# Patient Record
Sex: Female | Born: 1990 | Race: Black or African American | Hispanic: No | Marital: Single | Smoking: Former smoker
Health system: Southern US, Community
[De-identification: ages and names within clinical notes are randomized; demographics above are authoritative.]

## PROBLEM LIST (undated history)

## (undated) DIAGNOSIS — D573 Sickle-cell trait: Secondary | ICD-10-CM

## (undated) DIAGNOSIS — T8859XA Other complications of anesthesia, initial encounter: Secondary | ICD-10-CM

## (undated) DIAGNOSIS — F419 Anxiety disorder, unspecified: Secondary | ICD-10-CM

## (undated) DIAGNOSIS — G932 Benign intracranial hypertension: Secondary | ICD-10-CM

## (undated) HISTORY — PX: CHOLECYSTECTOMY: SHX55

## (undated) HISTORY — DX: Benign intracranial hypertension: G93.2

## (undated) HISTORY — PX: FACIAL COSMETIC SURGERY: SHX629

---

## 2016-09-15 ENCOUNTER — Ambulatory Visit (HOSPITAL_COMMUNITY)
Admission: EM | Admit: 2016-09-15 | Discharge: 2016-09-15 | Disposition: A | Discharge status: Home / Self Care | Payer: BLUE CROSS/BLUE SHIELD | Attending: Internal Medicine | Admitting: Internal Medicine

## 2016-09-15 ENCOUNTER — Encounter (HOSPITAL_COMMUNITY): Payer: Self-pay | Admitting: *Deleted

## 2016-09-15 DIAGNOSIS — N898 Other specified noninflammatory disorders of vagina: Secondary | ICD-10-CM | POA: Insufficient documentation

## 2016-09-15 DIAGNOSIS — F17200 Nicotine dependence, unspecified, uncomplicated: Secondary | ICD-10-CM | POA: Insufficient documentation

## 2016-09-15 DIAGNOSIS — N930 Postcoital and contact bleeding: Secondary | ICD-10-CM | POA: Diagnosis not present

## 2016-09-15 DIAGNOSIS — Z3202 Encounter for pregnancy test, result negative: Secondary | ICD-10-CM | POA: Diagnosis not present

## 2016-09-15 DIAGNOSIS — N939 Abnormal uterine and vaginal bleeding, unspecified: Secondary | ICD-10-CM | POA: Diagnosis present

## 2016-09-15 LAB — POCT URINALYSIS DIP (DEVICE)
BILIRUBIN URINE: NEGATIVE
Glucose, UA: NEGATIVE mg/dL
Hgb urine dipstick: NEGATIVE
KETONES UR: NEGATIVE mg/dL
Leukocytes, UA: NEGATIVE
Nitrite: NEGATIVE
PH: 7 (ref 5.0–8.0)
Protein, ur: NEGATIVE mg/dL
Specific Gravity, Urine: 1.015 (ref 1.005–1.030)
Urobilinogen, UA: 0.2 mg/dL (ref 0.0–1.0)

## 2016-09-15 LAB — POCT PREGNANCY, URINE: Preg Test, Ur: NEGATIVE

## 2016-09-15 MED ORDER — CEFTRIAXONE SODIUM 250 MG IJ SOLR
INTRAMUSCULAR | Status: AC
  Filled 2016-09-15: qty 250

## 2016-09-15 MED ORDER — CEFTRIAXONE SODIUM 250 MG IJ SOLR
250.0000 mg | Freq: Once | INTRAMUSCULAR | Status: AC
  Administered 2016-09-15: 250 mg via INTRAMUSCULAR

## 2016-09-15 MED ORDER — AZITHROMYCIN 250 MG PO TABS
ORAL_TABLET | ORAL | Status: AC
  Filled 2016-09-15: qty 4

## 2016-09-15 MED ORDER — LIDOCAINE HCL (PF) 1 % IJ SOLN
INTRAMUSCULAR | Status: AC
  Filled 2016-09-15: qty 2

## 2016-09-15 MED ORDER — AZITHROMYCIN 250 MG PO TABS
1000.0000 mg | ORAL_TABLET | Freq: Once | ORAL | Status: AC
  Administered 2016-09-15: 1000 mg via ORAL

## 2016-09-15 MED ORDER — METRONIDAZOLE 500 MG PO TABS: 500.0000 mg | ORAL_TABLET | Freq: Two times a day (BID) | ORAL | 0 refills | Status: DC

## 2016-09-15 NOTE — ED Provider Notes (Signed)
CSN: [id number]     Arrival date & time 09/15/16  1308 History   None    Chief Complaint  Patient presents with  . Vaginal Bleeding   (Consider location/radiation/quality/duration/timing/severity/associated sxs/prior Treatment) Patient c/o bleeding after having sex.  She has been experiencing vaginal bleeding after sex and denies any pain with sex.   The history is provided by the patient.  Vaginal Bleeding  Severity:  Moderate Onset quality:  Sudden   History reviewed. No pertinent past medical history. History reviewed. No pertinent surgical history. History reviewed. No pertinent family history. Social History  Substance Use Topics  . Smoking status: Current Every Day Smoker  . Smokeless tobacco: Never Used  . Alcohol use Yes   OB History    No data available     Review of Systems  Constitutional: Negative.   HENT: Negative.   Eyes: Negative.   Respiratory: Negative.   Cardiovascular: Negative.   Gastrointestinal: Negative.   Endocrine: Negative.   Genitourinary: Positive for vaginal bleeding.  Musculoskeletal: Negative.   Allergic/Immunologic: Negative.   Neurological: Negative.     Allergies  Patient has no known allergies.  Home Medications   Prior to Admission medications   Medication Sig Start Date End Date Taking? Authorizing Provider  metroNIDAZOLE (FLAGYL) 500 MG tablet Take 1 tablet (500 mg total) by mouth 2 (two) times daily. 09/15/16   Deatra Canterxford, William J, FNP   Meds Ordered and Administered this Visit   Medications  cefTRIAXone (ROCEPHIN) injection 250 mg (not administered)  azithromycin (ZITHROMAX) tablet 1,000 mg (not administered)    BP 124/72 (BP Location: Right Arm)   Pulse 82   Temp 98.5 F (36.9 C) (Oral)   Resp 18   LMP 08/28/2016 (Within Days)   SpO2 98%  No data found.   Physical Exam  Constitutional: She appears well-developed and well-nourished.  HENT:  Head: Normocephalic and atraumatic.  Cardiovascular: Normal rate,  regular rhythm and normal heart sounds.   Pulmonary/Chest: Effort normal and breath sounds normal.  Abdominal: Soft. Bowel sounds are normal.  Genitourinary: Vagina normal.  Genitourinary Comments: BUS wnl Vagina wnl Cervix with bloody discharge and petichiae  Nursing note and vitals reviewed.   Urgent Care Course     Procedures (including critical care time)  Labs Review Labs Reviewed  POCT URINALYSIS DIP (DEVICE)  POCT PREGNANCY, URINE  CERVICOVAGINAL ANCILLARY ONLY    Imaging Review No results found.   Visual Acuity Review  Right Eye Distance:   Left Eye Distance:   Bilateral Distance:    Right Eye Near:   Left Eye Near:    Bilateral Near:         MDM   1. PCB (post coital bleeding)   2. Vaginal discharge    Azithromycin 250mg  x 4 Rocephin 250mg IM Cytology GC / Chlamydia Trich BD affirm  Referral to [facility]    [place], William J, FNP 09/15/16 2042    Deatra Canterxford, William J, FNP 09/15/16 2048

## 2016-09-15 NOTE — ED Triage Notes (Signed)
Pt  Reports   After   She  Has   Sex   She  Has  Vaginal  Bleeding      sllight   Low   abd  Cramping  As   Well

## 2016-09-16 LAB — CERVICOVAGINAL ANCILLARY ONLY
Bacterial vaginitis: NEGATIVE
Candida vaginitis: NEGATIVE
Chlamydia: NEGATIVE
Neisseria Gonorrhea: NEGATIVE
Trichomonas: NEGATIVE

## 2018-10-15 ENCOUNTER — Encounter: Payer: Self-pay | Admitting: Neurology

## 2018-10-15 NOTE — Progress Notes (Addendum)
Virtual Visit via Video Note The purpose of this virtual visit is to provide medical care while limiting exposure to the novel coronavirus.    Consent was obtained for video visit:  Yes.   Answered questions that patient had about telehealth interaction:  Yes.   I discussed the limitations, risks, security and privacy concerns of performing an evaluation and management service by telemedicine. I also discussed with the patient that there may be a patient responsible charge related to this service. The patient expressed understanding and agreed to proceed.  Pt location: Home Physician Location: office Name of referring provider:  Jilda Roche, OD I connected with Kaitlin Bryant at patients initiation/request on 10/16/2018 at  2:10 PM EDT by video enabled telemedicine application and verified that I am speaking with the correct person using two identifiers. Pt MRN:  [id number] Pt DOB:  09-09-1990 Video Participants:  Kaitlin Bryant   History of Present Illness:  Kaitlin Bryant is a 28 year old African American woman who presents for papilledema.  History supplemented by referring provider note.  For years, she reports pulsatile tinnitus, like a whooshing sound in her ears.  She has occasional headaches which are manageable.  She denies blurred vision or visual obscurations.  She typically has an eye exam every one or two years.  Her last exam was at most 2 years ago.  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  She does report having gained at least 20 lbs over the past year.  She is not on birth control.   Past Medical History: History reviewed. No pertinent past medical history.  Medications: Outpatient Encounter Medications as of 10/16/2018  Medication Sig  . metroNIDAZOLE (FLAGYL) 500 MG tablet Take 1 tablet (500 mg total) by mouth 2 (two) times daily.   No facility-administered encounter medications on file as of 10/16/2018.      Allergies: No Known Allergies  Family History: No family history on file.  Social History: Social History   Socioeconomic History  . Marital status: Single    Spouse name: Not on file  . Number of children: Not on file  . Years of education: Not on file  . Highest education level: Not on file  Occupational History  . Not on file  Social Needs  . Financial resource strain: Not on file  . Food insecurity    Worry: Not on file    Inability: Not on file  . Transportation needs    Medical: Not on file    Non-medical: Not on file  Tobacco Use  . Smoking status: Current Every Day Smoker  . Smokeless tobacco: Never Used  Substance and Sexual Activity  . Alcohol use: Yes  . Drug use: Not on file  . Sexual activity: Yes  Lifestyle  . Physical activity    Days per week: Not on file    Minutes per session: Not on file  . Stress: Not on file  Relationships  . Social Herbalist on phone: Not on file    Gets together: Not on file    Attends religious service: Not on file    Active member of club or organization: Not on file    Attends meetings of clubs or organizations: Not on file    Relationship status: Not on file  . Intimate partner violence    Fear of current or ex partner: Not on file    Emotionally abused: Not on  file    Physically abused: Not on file    Forced sexual activity: Not on file  Other Topics Concern  . Not on file  Social History Narrative  . Not on file    Observations/Objective:   Height 5\' 5"  (1.651 m), weight 250 lb (113.4 kg). No acute distress. Alert and oriented.  Speech fluent and not dysarthric.  Language intact.  Face symmetric  Assessment and Plan:   1. Papilledema, bilateral.  Concern for idiopathic intracranial hypertension  1.  Check MRI of brain/orbits with and without contrast and MRV of brain to rule out mass lesion or venous sinus thrombosis. 2.  MRI will be followed by lumbar puncture, measuring opening pressure and  checking CSF cell count, protein, glucose, gram stain and culture. 3.  Further recommendations pending results.  If testing consistent with IIH, will initiate acetazolamide 500mg  twice daily and have her get a repeat eye exam in 6 weeks. 4.  Discussed importance of weight loss 5.  Follow up with me in 4 months.  Follow Up Instructions:    -I discussed the assessment and treatment plan with the patient. The patient was provided an opportunity to ask questions and all were answered. The patient agreed with the plan and demonstrated an understanding of the instructions.   The patient was advised to call back or seek an in-person evaluation if the symptoms worsen or if the condition fails to improve as anticipated.    Total Time spent in visit with the patient was:  40 minutes  Cira ServantAdam Robert Jaffe, DO

## 2018-10-16 ENCOUNTER — Encounter: Payer: Self-pay | Admitting: Neurology

## 2018-10-16 ENCOUNTER — Telehealth (INDEPENDENT_AMBULATORY_CARE_PROVIDER_SITE_OTHER): Payer: Medicaid Other | Admitting: Neurology

## 2018-10-16 ENCOUNTER — Other Ambulatory Visit: Payer: Self-pay

## 2018-10-16 VITALS — Ht 65.0 in | Wt 250.0 lb

## 2018-10-16 DIAGNOSIS — H4710 Unspecified papilledema: Secondary | ICD-10-CM | POA: Diagnosis not present

## 2018-10-17 ENCOUNTER — Telehealth: Payer: Self-pay | Admitting: Neurology

## 2018-10-17 NOTE — Telephone Encounter (Signed)
Pt request return call [phone] pt has concerns about prognosis and pt states provider mentioned medication. Pt wants to know also if any medication will be called in and if so please advise and instruct. Pt uses [facility] off [address].

## 2018-10-17 NOTE — Telephone Encounter (Signed)
Called and discussed the types of imaging studies ordered, the LP to be performed after, and the possibility of medication.   We went over weight loss recommendation also.  I provided her with contact information and location of [facility] and advised her if she has not heard from them within 5-7 business days, she may call to schedule. I talked with her about My Chart and Pt wishes to sign up and utilize. I generated a code and provided that to her. The Pt will call or send message via My Chart with any additional questions.

## 2018-10-17 NOTE — Addendum Note (Signed)
Addended by: Clois Comber on: 10/17/2018 09:08 AM   Modules accepted: Orders

## 2018-11-14 ENCOUNTER — Ambulatory Visit
Admission: RE | Admit: 2018-11-14 | Discharge: 2018-11-14 | Disposition: A | Discharge status: Home / Self Care | Payer: Medicaid Other | Source: Ambulatory Visit | Attending: Neurology | Admitting: Neurology

## 2018-11-14 ENCOUNTER — Other Ambulatory Visit: Payer: Self-pay

## 2018-11-14 DIAGNOSIS — H4710 Unspecified papilledema: Secondary | ICD-10-CM

## 2018-11-14 MED ORDER — GADOBENATE DIMEGLUMINE 529 MG/ML IV SOLN
20.0000 mL | Freq: Once | INTRAVENOUS | Status: AC | PRN
  Administered 2018-11-14: 20 mL via INTRAVENOUS

## 2018-11-16 ENCOUNTER — Telehealth: Payer: Self-pay | Admitting: Neurology

## 2018-11-16 ENCOUNTER — Telehealth: Payer: Self-pay

## 2018-11-16 ENCOUNTER — Other Ambulatory Visit: Payer: Medicaid Other

## 2018-11-16 NOTE — Telephone Encounter (Signed)
-----   Message from Pieter Partridge, DO sent at 11/15/2018  3:50 PM EDT ----- MRI of brain shows a spot that is nonspecific.  Unclear clinical significance, but would repeat MRI of brain without contrast in 6 months to evaluate for any changes.  We should now proceed with lumbar puncture, measuring opening pressure and checking CSF cell count, protein, glucose, gram stain and culture.

## 2018-11-16 NOTE — Telephone Encounter (Signed)
See MRI results note

## 2018-11-16 NOTE — Telephone Encounter (Signed)
Called Pt and advised of results and to proceed with LP

## 2018-11-16 NOTE — Telephone Encounter (Signed)
Patient was calling in to ask some questions about results she got back on MyChart. Thanks!

## 2018-11-19 ENCOUNTER — Other Ambulatory Visit: Payer: Self-pay

## 2018-11-19 ENCOUNTER — Ambulatory Visit
Admission: RE | Admit: 2018-11-19 | Discharge: 2018-11-19 | Disposition: A | Discharge status: Home / Self Care | Payer: Medicaid Other | Source: Ambulatory Visit | Attending: Neurology | Admitting: Neurology

## 2018-11-19 ENCOUNTER — Telehealth: Payer: Self-pay

## 2018-11-19 DIAGNOSIS — Z79899 Other long term (current) drug therapy: Secondary | ICD-10-CM

## 2018-11-19 DIAGNOSIS — G932 Benign intracranial hypertension: Secondary | ICD-10-CM

## 2018-11-19 DIAGNOSIS — H4710 Unspecified papilledema: Secondary | ICD-10-CM

## 2018-11-19 MED ORDER — ACETAZOLAMIDE ER 500 MG PO CP12: 500.0000 mg | ORAL_CAPSULE | Freq: Two times a day (BID) | ORAL | 3 refills | Status: DC

## 2018-11-19 NOTE — Telephone Encounter (Signed)
-----   Message from Pieter Partridge, DO sent at 11/19/2018 10:21 AM EDT ----- I would also like to check a baseline BMP.

## 2018-11-19 NOTE — Discharge Instructions (Signed)

## 2018-11-19 NOTE — Telephone Encounter (Signed)
Called and advised Pt of LP results,  to start acetazolamide, and BMP labs

## 2018-11-20 ENCOUNTER — Telehealth: Payer: Self-pay | Admitting: Neurology

## 2018-11-20 DIAGNOSIS — G932 Benign intracranial hypertension: Secondary | ICD-10-CM

## 2018-11-20 NOTE — Telephone Encounter (Signed)
Patient left msg with after hours and wants her RX sent to a different pharm- [facility] and the phone # is [phone]. Acetazolamide medication. She said she has no symptoms.

## 2018-11-21 MED ORDER — ACETAZOLAMIDE ER 500 MG PO CP12: 500.0000 mg | ORAL_CAPSULE | Freq: Two times a day (BID) | ORAL | 3 refills | Status: DC

## 2018-11-21 NOTE — Telephone Encounter (Signed)
Acetazolamide resent to [facility] per patient request.  Informed patient.

## 2018-11-22 ENCOUNTER — Other Ambulatory Visit: Payer: Self-pay

## 2018-11-22 ENCOUNTER — Other Ambulatory Visit (INDEPENDENT_AMBULATORY_CARE_PROVIDER_SITE_OTHER): Payer: Medicaid Other

## 2018-11-22 DIAGNOSIS — Z79899 Other long term (current) drug therapy: Secondary | ICD-10-CM | POA: Diagnosis not present

## 2018-11-23 LAB — CSF CELL COUNT WITH DIFFERENTIAL
RBC Count, CSF: 0 cells/uL (ref 0–10)
WBC, CSF: 0 cells/uL (ref 0–5)

## 2018-11-23 LAB — CSF CULTURE: MICRO NUMBER:: 683309

## 2018-11-23 LAB — CSF CULTURE W GRAM STAIN
Result:: NO GROWTH
SPECIMEN QUALITY:: ADEQUATE

## 2018-11-23 LAB — BASIC METABOLIC PANEL
BUN: 14 mg/dL (ref 7–25)
CO2: 18 mmol/L — ABNORMAL LOW (ref 20–32)
Calcium: 9.4 mg/dL (ref 8.6–10.2)
Chloride: 109 mmol/L (ref 98–110)
Creat: 0.98 mg/dL (ref 0.50–1.10)
Glucose, Bld: 102 mg/dL — ABNORMAL HIGH (ref 65–99)
Potassium: 4 mmol/L (ref 3.5–5.3)
Sodium: 137 mmol/L (ref 135–146)

## 2018-11-23 LAB — PROTEIN, CSF: Total Protein, CSF: 28 mg/dL (ref 15–45)

## 2018-11-23 LAB — GLUCOSE, CSF: Glucose, CSF: 56 mg/dL (ref 40–80)

## 2018-12-17 ENCOUNTER — Telehealth: Payer: Self-pay | Admitting: Neurology

## 2018-12-17 NOTE — Telephone Encounter (Signed)
Patient was calling in about her medication. Thanks!

## 2018-12-18 NOTE — Telephone Encounter (Signed)
Called and advised Pt to continue medication until seen and Dr. Tomi Likens will reassess.  Reminded her to make appt with eye dr. ASAP.

## 2018-12-18 NOTE — Telephone Encounter (Signed)
Patient is wanting to know if she is supposed to keep taking the acetazolamide medication. Please call her back at [phone]. Thanks!

## 2019-02-18 NOTE — Progress Notes (Signed)
NEUROLOGY FOLLOW UP OFFICE NOTE  Barbie Croston [id number]  HISTORY OF PRESENT ILLNESS: Kaitlin Bryant is a 28 year old female who follows up for idiopathic intracranial hypertension.  UPDATE: MRI of brain/orbits with and without contrast and MRV of head from 11/14/2018 showed small nonspecific 4 mm T2/FLAIR signal focus in the dorsal left thalamus with no associated enhancement.  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  MRV showed effaced appearance of the bilateral transverse/sigmoid sinus junctions but no dural sinus thrombosis.   She underwent LP on 11/19/2018, which demonstrated an elevated opening pressure of 33 cm H2O.  CSF cell count, protein, glucose and Gram stain & culture negative.    She was started on acetazolamide 500mg  twice daily.  She was advised to have a follow exam with her eye doctor.  She hasn't had a chance to follow up.    She reports that the pulsatile tinnitus is much less.  She hasn't had any worsening of vision.  Headaches improved.  She has lost a little over 10 lbs since last time.    HISTORY: For years, she reports pulsatile tinnitus, like a whooshing sound in her ears.  She has occasional headaches which are manageable.  She denies blurred vision or visual obscurations.  She typically has an eye exam every one or two years.  Her last exam was at most 2 years ago.  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  She does report having gained at least 20 lbs over the past year.  She is not on birth control.   PAST MEDICAL HISTORY: No past medical history on file.  MEDICATIONS: Current Outpatient Medications on File Prior to Visit  Medication Sig Dispense Refill  . acetaZOLAMIDE (DIAMOX) 500 MG capsule Take 1 capsule (500 mg total) by mouth 2 (two) times daily. 180 capsule 3   No current facility-administered medications on file prior to  visit.     ALLERGIES: No Known Allergies  FAMILY HISTORY: Family History  Problem Relation Age of Onset  . Healthy Mother   . Healthy Father   . Healthy Sister   . Healthy Brother   . Healthy Daughter     SOCIAL HISTORY: Social History   Socioeconomic History  . Marital status: Single    Spouse name: Not on file  . Number of children: 1  . Years of education: Not on file  . Highest education level: Not on file  Occupational History  . Not on file  Social Needs  . Financial resource strain: Not on file  . Food insecurity    Worry: Not on file    Inability: Not on file  . Transportation needs    Medical: Not on file    Non-medical: Not on file  Tobacco Use  . Smoking status: Current Every Day Smoker  . Smokeless tobacco: Never Used  Substance and Sexual Activity  . Alcohol use: Yes  . Drug use: Not Currently  . Sexual activity: Yes  Lifestyle  . Physical activity    Days per week: Not on file    Minutes per session: Not on file  . Stress: Not on file  Relationships  . Social on phone: Not on file    Gets together: Not on file    Attends religious service: Not on file    Active member of club or organization: Not  on file    Attends meetings of clubs or organizations: Not on file    Relationship status: Not on file  . Intimate partner violence    Fear of current or ex partner: Not on file    Emotionally abused: Not on file    Physically abused: Not on file    Forced sexual activity: Not on file  Other Topics Concern  . Not on file  Social History Narrative   Right handed   Lives in single story home with daughter and boyfreind   Works at [employer]: Constitutional: No fevers, chills, or sweats, no generalized fatigue, change in appetite Eyes: No visual changes, double vision, eye pain Ear, nose and throat: No hearing loss, ear pain, nasal congestion, sore throat Cardiovascular: No chest pain, palpitations  Respiratory:  No shortness of breath at rest or with exertion, wheezes GastrointestinaI: No nausea, vomiting, diarrhea, abdominal pain, fecal incontinence Genitourinary:  No dysuria, urinary retention or frequency Musculoskeletal:  No neck pain, back pain Integumentary: No rash, pruritus, skin lesions Neurological: as above Psychiatric: No depression, insomnia, anxiety Endocrine: No palpitations, fatigue, diaphoresis, mood swings, change in appetite, change in weight, increased thirst Hematologic/Lymphatic:  No purpura, petechiae. Allergic/Immunologic: no itchy/runny eyes, nasal congestion, recent allergic reactions, rashes  PHYSICAL EXAM: Blood pressure 102/70, pulse 92, height 5\' 5"  (1.651 m), weight 244 lb (110.7 kg), SpO2 99 %. General: No acute distress.  Patient appears well-groomed.   Head:  Normocephalic/atraumatic Eyes:  Fundi examined but not visualized Neck: supple, no paraspinal tenderness, full range of motion Heart:  Regular rate and rhythm Lungs:  Clear to auscultation bilaterally Back: No paraspinal tenderness Neurological Exam: alert and oriented to person, place, and time. Attention span and concentration intact, recent and remote memory intact, fund of knowledge intact.  Speech fluent and not dysarthric, language intact.  CN II-XII intact. Bulk and tone normal, muscle strength 5/5 throughout.  Sensation to light touch  intact.  Deep tendon reflexes 2+ throughout, toes downgoing.  Finger to nose and heel to shin testing intact.  Gait normal, Romberg negative.  IMPRESSION: Idiopathic intracranial hypertension  PLAN: 1.  Acetazolamide 500mg  twice daily 2.  Refer to ophthalmology 3.  Weight loss 4.  Follow up in 4 months.  Metta Clines, DO

## 2019-02-19 ENCOUNTER — Other Ambulatory Visit: Payer: Self-pay

## 2019-02-19 ENCOUNTER — Ambulatory Visit: Payer: Medicaid Other | Admitting: Neurology

## 2019-02-19 ENCOUNTER — Encounter: Payer: Self-pay | Admitting: Neurology

## 2019-02-19 VITALS — BP 102/70 | HR 92 | Ht 65.0 in | Wt 244.0 lb

## 2019-02-19 DIAGNOSIS — G932 Benign intracranial hypertension: Secondary | ICD-10-CM | POA: Diagnosis not present

## 2019-02-19 NOTE — Patient Instructions (Addendum)
1.  We will refer you to the eye doctor 2.  Continue acetazolamide 500mg  twice daily 3.  Follow up in 4 months  Dr.Weaver at [facility] [address] [place], [place] [address] Phone: [phone]

## 2019-06-06 ENCOUNTER — Telehealth: Payer: Self-pay | Admitting: Neurology

## 2019-06-06 ENCOUNTER — Encounter: Payer: Self-pay | Admitting: Neurology

## 2019-06-06 NOTE — Telephone Encounter (Signed)
Pt called and informed It just sounds like muscle tightness from stress.  I don't think it is related to the medication or increased intracranial pressure

## 2019-06-06 NOTE — Telephone Encounter (Signed)
I can't really give any advise because I don't know what she is talking about.  The acetazolamide isn't a new medication for her.

## 2019-06-06 NOTE — Telephone Encounter (Signed)
Patient wants to speak to someone about medication acetazolamide. She states that her forehead is jumping and she would like to know what to do please call

## 2019-06-06 NOTE — Telephone Encounter (Signed)
It just sounds like muscle tightness from stress.  I don't think it is related to the medication or increased intracranial pressure.

## 2019-06-06 NOTE — Telephone Encounter (Signed)
Spoke with pt she stated that this has been going on for 2 weeks her forehead gets tight then its relaxed, she has some increase in stress, pt stated that she wasn't going to call but her mom wanted her to call to just be safe,

## 2019-06-25 NOTE — Progress Notes (Signed)
Virtual Visit via Video Note The purpose of this virtual visit is to provide medical care while limiting exposure to the novel coronavirus.    Consent was obtained for video visit:  Yes.   Answered questions that patient had about telehealth interaction:  Yes.   I discussed the limitations, risks, security and privacy concerns of performing an evaluation and management service by telemedicine. I also discussed with the patient that there may be a patient responsible charge related to this service. The patient expressed understanding and agreed to proceed.  Pt location: Home Physician Location: office Name of referring provider:  No ref. provider found I connected with Kaitlin Bryant at patients initiation/request on 06/27/2019 at 10:10 AM EST by video enabled telemedicine application and verified that I am speaking with the correct person using two identifiers. Pt MRN:  [id number] Pt DOB:  [birth year] Video Participants:  [name]   History of Present Illness:  Kaitlin Bryant is a 29 year old female who follows up for idiopathic intracranial hypertension.  UPDATE: Current medication:  Acetazolamide 500mg  once daily  She saw Dr. Kathlen Mody of ophthalmology on 02/21/2019. She did not demonstrate disc edema on exam.  For the past 2 weeks, she had been taking acetazolamide 500mg  once daily.  She had follow up with Dr. Kathlen Mody on 06/24/2019 and still did not demonstrate any papilledema.  She feels fine.  No headache or blurred vision.     HISTORY: For years, she reports pulsatile tinnitus, like a whooshing sound in her ears. She has occasional headaches which are manageable. She denies blurred vision or visual obscurations. She typically has an eye exam every one or two years. Her last exam was at most 2 years ago. 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 She does report having gained at least 20 lbs over the past year. She is not on birth  control.   MRI of brain/orbits with and without contrast and MRV of head from 11/14/2018 showed small nonspecific 4 mm T2/FLAIR signal focus in the dorsal left thalamus with no associated enhancement.  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  MRV showed effaced appearance of the bilateral transverse/sigmoid sinus junctions but no dural sinus thrombosis.   She underwent LP on 11/19/2018, which demonstrated an elevated opening pressure of 33 cm H2O.  CSF cell count, protein, glucose and Gram stain & culture negative.    Past Medical History: No past medical history on file.  Medications: Outpatient Encounter Medications as of 06/27/2019  Medication Sig  . acetaZOLAMIDE (DIAMOX) 500 MG capsule Take 1 capsule (500 mg total) by mouth 2 (two) times daily.   No facility-administered encounter medications on file as of 06/27/2019.    Allergies: No Known Allergies  Family History: Family History  Problem Relation Age of Onset  . Healthy Mother   . Healthy Father   . Healthy Sister   . Healthy Brother   . Healthy Daughter     Social History: Social History   Socioeconomic History  . Marital status: Single    Spouse name: Not on file  . Number of children: 1  . Years of education: Not on file  . Highest education level: High school graduate  Occupational History  . Not on file  Tobacco Use  . Smoking status: Current Every Day Smoker  . Smokeless tobacco: Never Used  Substance and Sexual Activity  . Alcohol  use: Yes  . Drug use: Not Currently  . Sexual activity: Yes  Other Topics Concern  . Not on file  Social History Narrative   Right handed   Lives in single story home with daughter and boyfreind   Works at [employer] Resource Strain:   . Difficulty of Paying Living Expenses: Not on file  Food Insecurity:   . Worried About Programme researcher, broadcasting/film/video in the  Last Year: Not on file  . Ran Out of Food in the Last Year: Not on file  Transportation Needs:   . Lack of Transportation (Medical): Not on file  . Lack of Transportation (Non-Medical): Not on file  Physical Activity:   . Days of Exercise per Week: Not on file  . Minutes of Exercise per Session: Not on file  Stress:   . Feeling of Stress : Not on file  Social Connections:   . Frequency of Communication with Friends and Family: Not on file  . Frequency of Social Gatherings with Friends and Family: Not on file  . Attends Religious Services: Not on file  . Active Member of Clubs or Organizations: Not on file  . Attends Banker Meetings: Not on file  . Marital Status: Not on file  Intimate Partner Violence:   . Fear of Current or Ex-Partner: Not on file  . Emotionally Abused: Not on file  . Physically Abused: Not on file  . Sexually Abused: Not on file    Observations/Objective:   Height 5\' 4"  (1.626 m), weight 240 lb (108.9 kg). No acute distress.  Alert and oriented.  Speech fluent and not dysarthric.  Language intact.  Eyes orthophoric on primary gaze.  Face symmetric.  Assessment and Plan:   Idiopathic intracranial hypertension.  Currently without papilledema.  1.  Continue acetazolamide 500mg  daily for 2 weeks, then stop 2.  Follow up with Dr. in 3 months for repeat eye exam 3.  Weight loss  4.  Follow up with me in 4 months.  Follow Up Instructions:    -I discussed the assessment and treatment plan with the patient. The patient was provided an opportunity to ask questions and all were answered. The patient agreed with the plan and demonstrated an understanding of the instructions.   The patient was advised to call back or seek an in-person evaluation if the symptoms worsen or if the condition fails to improve as anticipated.    , DO

## 2019-06-26 ENCOUNTER — Encounter: Payer: Self-pay | Admitting: Neurology

## 2019-06-27 ENCOUNTER — Other Ambulatory Visit: Payer: Self-pay

## 2019-06-27 ENCOUNTER — Encounter: Payer: Self-pay | Admitting: Neurology

## 2019-06-27 ENCOUNTER — Telehealth (INDEPENDENT_AMBULATORY_CARE_PROVIDER_SITE_OTHER): Payer: Medicaid Other | Admitting: Neurology

## 2019-06-27 VITALS — Ht 64.0 in | Wt 240.0 lb

## 2019-06-27 DIAGNOSIS — G932 Benign intracranial hypertension: Secondary | ICD-10-CM

## 2019-10-28 NOTE — Progress Notes (Signed)
NEUROLOGY FOLLOW UP OFFICE NOTE  Kaitlin Bryant [id number]  HISTORY OF PRESENT ILLNESS: Kaitlin Bryant is a 16 year oldfemale who follows up for idiopathic intracranial hypertension.  UPDATE: Discontinued acetazolamide in March.  She is feeling well.  She has not yet followed up with Dr. Kathlen Mody.  Sometimes notes mild pulsatile tinnitus occasionally but nothing like how it was in the past.  No visual obscurations or headaches.    HISTORY: For years, she reports pulsatile tinnitus, like a whooshing sound in her ears. She has occasional headaches which are manageable. She denies blurred vision or visual obscurations. She typically has an eye exam every one or two years. Her last exam was at most 2 years ago. 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 She does report having gained at least 20 lbs over the past year. She is not on birth control.  MRI of brain/orbits with and without contrast and MRV of head from 11/14/2018 showed small nonspecific 4 mm T2/FLAIR signal focus in the dorsal left thalamus with no associated enhancement. 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 MRV showed effaced appearance of the bilateral transverse/sigmoid sinus junctions but no dural sinus thrombosis.   She underwent LP on 11/19/2018, which demonstrated an elevated opening pressure of 33 cm H2O. CSF cell count, protein, glucose and Gram stain &culture negative.   She saw Dr. Kathlen Mody of ophthalmology on 02/21/2019. She did not demonstrate disc edema on exam.  Follow up with Dr. Kathlen Mody on 06/24/2019 and still did not demonstrate any papilledema.   PAST MEDICAL HISTORY: Past Medical History:  Diagnosis Date   Idiopathic intracranial hypertension     MEDICATIONS: Current Outpatient Medications on File Prior to Visit  Medication Sig Dispense Refill   acetaZOLAMIDE (DIAMOX)  500 MG capsule Take 1 capsule (500 mg total) by mouth 2 (two) times daily. 180 capsule 3   No current facility-administered medications on file prior to visit.    ALLERGIES: No Known Allergies  FAMILY HISTORY: Family History  Problem Relation Age of Onset   Healthy Mother    Healthy Father    Healthy Sister    Healthy Brother    Healthy Daughter     SOCIAL HISTORY: Social History   Socioeconomic History   Marital status: Single    Spouse name: Not on file   Number of children: 1   Years of education: Not on file   Highest education level: High school graduate  Occupational History   Not on file  Tobacco Use   Smoking status: Current Every Day Smoker   Smokeless tobacco: Never Used  Scientific laboratory technician Use: Never used  Substance and Sexual Activity   Alcohol use: Yes   Drug use: Not Currently   Sexual activity: Yes  Other Topics Concern   Not on file  Social History Narrative   Right handed   Lives in single story home with daughter and boyfreind   Works at [employer] Strain:    Difficulty of Paying Living Expenses:   Food Insecurity:    Worried About Charity fundraiser in the Last Year:    Arboriculturist in the Last Year:   Transportation Needs:    Film/video editor (Medical):    Lack of Transportation (Non-Medical):   Physical Activity:    Days of Exercise  per Week:    Minutes of Exercise per Session:   Stress:    Feeling of Stress :   Social Connections:    Frequency of Communication with Friends and Family:    Frequency of Social Gatherings with Friends and Family:    Attends Religious Services:    Active Member of Clubs or Organizations:    Attends Engineer, structural:    Marital Status:   Intimate Partner Violence:    Fear of Current or Ex-Partner:    Emotionally Abused:    Physically Abused:    Sexually Abused:      PHYSICAL  EXAM: Blood pressure 123/84, pulse 86, height 5\' 4"  (1.626 m), weight 252 lb (114.3 kg), SpO2 99 %. General: No acute distress.  Patient appears well-groomed.   Head:  Normocephalic/atraumatic Eyes:  Fundi examined but not visualized Neck: supple, no paraspinal tenderness, full range of motion Heart:  Regular rate and rhythm Lungs:  Clear to auscultation bilaterally Back: No paraspinal tenderness Neurological Exam: alert and oriented to person, place, and time. Attention span and concentration intact, recent and remote memory intact, fund of knowledge intact.  Speech fluent and not dysarthric, language intact.  CN II-XII intact. Bulk and tone normal, muscle strength 5/5 throughout.  Sensation to light touch, temperature and vibration intact.  Deep tendon reflexes 2+ throughout, toes downgoing.  Finger to nose and heel to shin testing intact.  Gait normal, Romberg negative.  IMPRESSION: Idiopathic intracranial hypertension  PLAN: 1.  Follow up with Dr. for re-evaluation.  Any recurrence of papilledema, will restart acetazolamide 2.  Follow up in 6 months  Alben Spittle, DO

## 2019-10-29 ENCOUNTER — Encounter: Payer: Self-pay | Admitting: Neurology

## 2019-10-29 ENCOUNTER — Ambulatory Visit: Payer: Medicaid Other | Admitting: Neurology

## 2019-10-29 ENCOUNTER — Other Ambulatory Visit: Payer: Self-pay

## 2019-10-29 VITALS — BP 123/84 | HR 86 | Ht 64.0 in | Wt 252.0 lb

## 2019-10-29 DIAGNOSIS — G932 Benign intracranial hypertension: Secondary | ICD-10-CM

## 2019-10-29 NOTE — Patient Instructions (Signed)
1.  Follow up with Dr. Alben Spittle.  Contact his office. 2.  Follow up in 6 months.

## 2020-04-27 NOTE — Progress Notes (Deleted)
NEUROLOGY FOLLOW UP OFFICE NOTE  Kaitlin Bryant [id number]   Subjective:  Kaitlin Bryant is a 29 year oldfemale who follows up for idiopathic intracranial hypertension.  UPDATE: Off acetazolamide.  ***.  Has not followed up with Dr. Alben Spittle.    HISTORY: For years, she reports pulsatile tinnitus, like a whooshing sound in her ears. She has occasional headaches which are manageable. She denies blurred vision or visual obscurations. She typically has an eye exam every one or two years. Her last exam was at most 2 years ago. 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 She does report having gained at least 20 lbs over the past year. She is not on birth control.  MRI of brain/orbits with and without contrast and MRV of head from 11/14/2018 showed small nonspecific 4 mm T2/FLAIR signal focus in the dorsal left thalamus with no associated enhancement. 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 MRV showed effaced appearance of the bilateral transverse/sigmoid sinus junctions but no dural sinus thrombosis.   She underwent LP on 11/19/2018, which demonstrated an elevated opening pressure of 33 cm H2O. CSF cell count, protein, glucose and Gram stain &culture negative.  She saw Dr. Alben Spittle of ophthalmology on 02/21/2019. She did not demonstrate disc edema on exam.Follow up with Dr. Alben Spittle on 06/24/2019 and still did not demonstrate any papilledema.   PAST MEDICAL HISTORY: Past Medical History:  Diagnosis Date  . Idiopathic intracranial hypertension     MEDICATIONS: Current Outpatient Medications on File Prior to Visit  Medication Sig Dispense Refill  . acetaZOLAMIDE (DIAMOX) 500 MG capsule Take 1 capsule (500 mg total) by mouth 2 (two) times daily. (Patient not taking: Reported on 10/29/2019) 180 capsule 3   No current facility-administered medications on  file prior to visit.    ALLERGIES: No Known Allergies  FAMILY HISTORY: Family History  Problem Relation Age of Onset  . Healthy Mother   . Healthy Father   . Healthy Sister   . Healthy Brother   . Healthy Daughter     SOCIAL HISTORY: Social History   Socioeconomic History  . Marital status: Single    Spouse name: Not on file  . Number of children: 1  . Years of education: Not on file  . Highest education level: High school graduate  Occupational History  . Not on file  Tobacco Use  . Smoking status: Current Every Day Smoker  . Smokeless tobacco: Never Used  Vaping Use  . Vaping Use: Never used  Substance and Sexual Activity  . Alcohol use: Yes  . Drug use: Not Currently  . Sexual activity: Yes  Other Topics Concern  . 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  Patient appears well-groomed.   Head:  Normocephalic/atraumatic Eyes:  Fundi examined but not visualized Neck: supple, no paraspinal tenderness, full range of motion Heart:  Regular rate and rhythm Lungs:  Clear to auscultation bilaterally Back: No paraspinal tenderness  Neurological Exam: alert and oriented to person, place, and time. Attention span and concentration intact, recent and remote memory intact, fund of knowledge intact.  Speech fluent and not dysarthric, language intact.  CN II-XII intact. Bulk and tone normal, muscle strength 5/5 throughout.  Sensation to light touch, temperature and vibration intact.  Deep tendon reflexes 2+ throughout, toes downgoing.  Finger to nose and heel to shin testing  intact.  Gait normal, Romberg negative.   Assessment/Plan:   Idiopathic intracranial hypertension  ***  Shon Millet, DO

## 2020-04-29 ENCOUNTER — Ambulatory Visit: Payer: Medicaid Other | Admitting: Neurology

## 2020-11-30 ENCOUNTER — Other Ambulatory Visit: Payer: Self-pay

## 2020-11-30 ENCOUNTER — Ambulatory Visit (INDEPENDENT_AMBULATORY_CARE_PROVIDER_SITE_OTHER): Payer: Medicaid Other

## 2020-11-30 VITALS — BP 112/72 | HR 102 | Ht 64.0 in | Wt 259.3 lb

## 2020-11-30 DIAGNOSIS — Z3480 Encounter for supervision of other normal pregnancy, unspecified trimester: Secondary | ICD-10-CM | POA: Insufficient documentation

## 2020-11-30 DIAGNOSIS — Z3481 Encounter for supervision of other normal pregnancy, first trimester: Secondary | ICD-10-CM | POA: Diagnosis not present

## 2020-11-30 DIAGNOSIS — O3680X0 Pregnancy with inconclusive fetal viability, not applicable or unspecified: Secondary | ICD-10-CM

## 2020-11-30 DIAGNOSIS — Z3491 Encounter for supervision of normal pregnancy, unspecified, first trimester: Secondary | ICD-10-CM | POA: Insufficient documentation

## 2020-11-30 DIAGNOSIS — Z3A08 8 weeks gestation of pregnancy: Secondary | ICD-10-CM | POA: Diagnosis not present

## 2020-11-30 MED ORDER — BLOOD PRESSURE KIT DEVI: 1.0000 | 0 refills | Status: AC

## 2020-11-30 NOTE — Progress Notes (Signed)
New OB Intake  I connected with  Kaitlin Bryant on 11/30/20 at 10:15 AM EDT by in person. Video Visit and verified that I am speaking with the correct person using two identifiers. Nurse is located at [facility] and pt is located at [place].  I discussed the limitations, risks, security and privacy concerns of performing an evaluation and management service by telephone and the availability of in person appointments. I also discussed with the patient that there may be a patient responsible charge related to this service. The patient expressed understanding and agreed to proceed.  I explained I am completing New OB Intake today. We discussed her EDD of 07/10/21 that is based on early u/s. Pt is G2/P1001. I reviewed her allergies, medications, Medical/Surgical/OB history, and appropriate screenings. I informed her of [facility] services. Based on history, this is a/an  pregnancy uncomplicated .   There are no problems to display for this patient.   Concerns addressed today  Delivery Plans:  Plans to deliver at [facility].   MyChart/Babyscripts MyChart access verified. I explained pt will have some visits in office and some virtually. Babyscripts instructions given and order placed. Patient verifies receipt of registration text/e-mail. Account successfully created and app downloaded.  Blood Pressure Cuff  Blood pressure cuff ordered for patient to pick-up from [employer]. Explained after first prenatal appt pt will check weekly and document in Babyscripts.  Weight scale: Patient has access to a weight scale at home.   Anatomy US Explained first scheduled [place] will be around 19 weeks. Dating and viability scan performed today.  Labs Discussed Avelina Laine genetic screening with patient. Would like both Panorama and Horizon drawn at new OB visit. Routine prenatal labs needed.  Covid Vaccine Patient has not covid vaccine.   Mother/ Baby Dyad Candidate?    If yes, offer as possibility  Informed  patient of [facility] website  and placed link in her AVS.   Social Determinants of Health Food Insecurity: Patient denies food insecurity. WIC Referral: Patient is interested in referral to [facility].  Transportation: Patient denies transportation needs. Childcare: Discussed no children allowed at ultrasound appointments. Offered childcare services; patient declines childcare services at this time.   Placed OB Box on problem list and updated  First visit review I reviewed new OB appt with pt. I explained she will have a pelvic exam, ob bloodwork with genetic screening, and PAP smear. Explained pt will be seen by Mariel Aloe at first visit; encounter routed to appropriate provider. Explained that patient will be seen by pregnancy navigator following visit with provider. [facility] information placed in AVS.   Hamilton Capri, RN 11/30/2020  10:19 AM

## 2020-12-01 NOTE — Progress Notes (Signed)
Patient was assessed and managed by nursing staff during this encounter. I have reviewed the chart and agree with the documentation and plan. I have also made any necessary editorial changes.  Catalina Antigua, MD 12/01/2020 10:56 AM

## 2020-12-08 ENCOUNTER — Other Ambulatory Visit (HOSPITAL_COMMUNITY)
Admission: RE | Admit: 2020-12-08 | Discharge: 2020-12-08 | Disposition: A | Discharge status: Home / Self Care | Payer: Medicaid Other | Source: Ambulatory Visit | Attending: Obstetrics and Gynecology | Admitting: Obstetrics and Gynecology

## 2020-12-08 ENCOUNTER — Other Ambulatory Visit: Payer: Self-pay

## 2020-12-08 ENCOUNTER — Ambulatory Visit (INDEPENDENT_AMBULATORY_CARE_PROVIDER_SITE_OTHER): Payer: Medicaid Other | Admitting: Obstetrics and Gynecology

## 2020-12-08 VITALS — BP 117/79 | HR 93 | Wt 256.3 lb

## 2020-12-08 DIAGNOSIS — Z3481 Encounter for supervision of other normal pregnancy, first trimester: Secondary | ICD-10-CM

## 2020-12-08 DIAGNOSIS — Z8669 Personal history of other diseases of the nervous system and sense organs: Secondary | ICD-10-CM

## 2020-12-08 DIAGNOSIS — Z6841 Body Mass Index (BMI) 40.0 and over, adult: Secondary | ICD-10-CM | POA: Insufficient documentation

## 2020-12-08 DIAGNOSIS — Z3A09 9 weeks gestation of pregnancy: Secondary | ICD-10-CM | POA: Insufficient documentation

## 2020-12-08 DIAGNOSIS — O99211 Obesity complicating pregnancy, first trimester: Secondary | ICD-10-CM | POA: Insufficient documentation

## 2020-12-08 MED ORDER — VITAFOL ULTRA 29-0.6-0.4-200 MG PO CAPS: 1.0000 | ORAL_CAPSULE | Freq: Every day | ORAL | 11 refills | Status: DC
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
## 2020-12-08 NOTE — Progress Notes (Signed)
Patient presents for New OB. Patient has no concerns today.  °

## 2020-12-09 ENCOUNTER — Other Ambulatory Visit: Payer: Self-pay

## 2020-12-09 DIAGNOSIS — B9689 Other specified bacterial agents as the cause of diseases classified elsewhere: Secondary | ICD-10-CM

## 2020-12-09 LAB — CERVICOVAGINAL ANCILLARY ONLY
Bacterial Vaginitis (gardnerella): POSITIVE — AB
Candida Glabrata: NEGATIVE
Candida Vaginitis: NEGATIVE
Chlamydia: NEGATIVE
Comment: NEGATIVE
Comment: NEGATIVE
Comment: NEGATIVE
Comment: NEGATIVE
Comment: NEGATIVE
Comment: NORMAL
Neisseria Gonorrhea: NEGATIVE
Trichomonas: NEGATIVE

## 2020-12-09 MED ORDER — METRONIDAZOLE 0.75 % VA GEL: 1.0000 | Freq: Two times a day (BID) | VAGINAL | 0 refills | Status: DC

## 2020-12-09 NOTE — Progress Notes (Signed)
Rx sent as advised by [facility] pt sent Mychart message.

## 2020-12-10 LAB — OBSTETRIC PANEL, INCLUDING HIV
Antibody Screen: NEGATIVE
Basophils Absolute: 0 10*3/uL (ref 0.0–0.2)
Basos: 0 %
EOS (ABSOLUTE): 0.1 10*3/uL (ref 0.0–0.4)
Eos: 1 %
HIV Screen 4th Generation wRfx: NONREACTIVE
Hematocrit: 33.1 % — ABNORMAL LOW (ref 34.0–46.6)
Hemoglobin: 11.1 g/dL (ref 11.1–15.9)
Hepatitis B Surface Ag: NEGATIVE
Immature Grans (Abs): 0 10*3/uL (ref 0.0–0.1)
Immature Granulocytes: 0 %
Lymphocytes Absolute: 1.9 10*3/uL (ref 0.7–3.1)
Lymphs: 15 %
MCH: 25.3 pg — ABNORMAL LOW (ref 26.6–33.0)
MCHC: 33.5 g/dL (ref 31.5–35.7)
MCV: 76 fL — ABNORMAL LOW (ref 79–97)
Monocytes Absolute: 1 10*3/uL — ABNORMAL HIGH (ref 0.1–0.9)
Monocytes: 8 %
Neutrophils Absolute: 9.9 10*3/uL — ABNORMAL HIGH (ref 1.4–7.0)
Neutrophils: 76 %
Platelets: 345 10*3/uL (ref 150–450)
RBC: 4.38 x10E6/uL (ref 3.77–5.28)
RDW: 15 % (ref 11.7–15.4)
RPR Ser Ql: NONREACTIVE
Rh Factor: POSITIVE
Rubella Antibodies, IGG: 4.19 index (ref >0.99)
WBC: 12.9 10*3/uL — ABNORMAL HIGH (ref 3.4–10.8)

## 2020-12-10 LAB — URINE CULTURE, OB REFLEX

## 2020-12-10 LAB — CULTURE, OB URINE

## 2020-12-10 LAB — HEPATITIS C ANTIBODY: Hep C Virus Ab: <0.1 s/co ratio (ref 0.0–0.9)

## 2020-12-11 LAB — CYTOLOGY - PAP
Comment: NEGATIVE
Diagnosis: NEGATIVE
High risk HPV: NEGATIVE

## 2021-01-06 ENCOUNTER — Other Ambulatory Visit (HOSPITAL_COMMUNITY)
Admission: RE | Admit: 2021-01-06 | Discharge: 2021-01-06 | Disposition: A | Discharge status: Home / Self Care | Payer: Medicaid Other | Source: Ambulatory Visit | Attending: Obstetrics and Gynecology | Admitting: Obstetrics and Gynecology

## 2021-01-06 ENCOUNTER — Ambulatory Visit (INDEPENDENT_AMBULATORY_CARE_PROVIDER_SITE_OTHER): Payer: Medicaid Other | Admitting: Obstetrics

## 2021-01-06 ENCOUNTER — Other Ambulatory Visit: Payer: Self-pay

## 2021-01-06 ENCOUNTER — Encounter: Payer: Self-pay | Admitting: Obstetrics

## 2021-01-06 VITALS — BP 101/66 | HR 99 | Wt 252.0 lb

## 2021-01-06 DIAGNOSIS — Z8669 Personal history of other diseases of the nervous system and sense organs: Secondary | ICD-10-CM | POA: Insufficient documentation

## 2021-01-06 DIAGNOSIS — Z3480 Encounter for supervision of other normal pregnancy, unspecified trimester: Secondary | ICD-10-CM

## 2021-01-06 DIAGNOSIS — O99210 Obesity complicating pregnancy, unspecified trimester: Secondary | ICD-10-CM

## 2021-01-06 NOTE — Addendum Note (Signed)
Addended by: Cheree Ditto, Griffey Nicasio A on: 01/06/2021 01:37 PM   Modules accepted: Orders

## 2021-01-06 NOTE — Progress Notes (Signed)
Subjective:  Kaitlin Bryant is a 30 y.o. G2P1001 at [redacted]w[redacted]d being seen today for ongoing prenatal care.  She is currently monitored for the following issues for this high-risk pregnancy and has Encounter for supervision of normal pregnancy in first trimester; [redacted] weeks gestation of pregnancy; History of idiopathic intracranial hypertension; BMI 40.0-44.9, adult (HCC); and Obesity during pregnancy in first trimester on their problem list.  Patient reports headache.  Contractions: Not present.  .  Movement: Absent. Denies leaking of fluid.   The following portions of the patient's history were reviewed and updated as appropriate: allergies, current medications, past family history, past medical history, past social history, past surgical history and problem list. Problem list updated.  Objective:   Vitals:   01/06/21 1040  BP: 101/66  Pulse: 99  Weight: 252 lb (114.3 kg)    Fetal Status:     Movement: Absent     General:  Alert, oriented and cooperative. Patient is in no acute distress.  Skin: Skin is warm and dry. No rash noted.   Cardiovascular: Normal heart rate noted  Respiratory: Normal respiratory effort, no problems with respiration noted  Abdomen: Soft, gravid, appropriate for gestational age. Pain/Pressure: Absent     Pelvic:  Cervical exam deferred        Extremities: Normal range of motion.  Edema: None  Mental Status: Normal mood and affect. Normal behavior. Normal judgment and thought content.   Urinalysis:      Assessment and Plan:  Pregnancy: G2P1001 at [redacted]w[redacted]d  1. Supervision of other normal pregnancy, antepartum  2. History of idiopathic intracranial hypertension - diagnosed by lumbar puncture in July 2020 because of headaches and pulsatile tinnitis.  Started on Acetazolamide by Neurology. - ophthalmology evaluation was negative for disc edema or papilledema - she did well and was taken off Acetazolamide in March 2021 - lost to follow up after June 2021 because of lack  of insurance - she has been asymptomatic since then - she has been scheduled for Neurology and Ophthalmology follow up, and encouraged to keep those appointments  3. Obesity affecting pregnancy, antepartum    Preterm labor symptoms and general obstetric precautions including but not limited to vaginal bleeding, contractions, leaking of fluid and fetal movement were reviewed in detail with the patient. Please refer to After Visit Summary for other counseling recommendations.   Return in about 4 weeks (around 02/03/2021) for [facility].   Brock Bad, MD  01/06/21

## 2021-01-07 LAB — CERVICOVAGINAL ANCILLARY ONLY
Bacterial Vaginitis (gardnerella): POSITIVE — AB
Candida Glabrata: NEGATIVE
Candida Vaginitis: NEGATIVE
Chlamydia: NEGATIVE
Comment: NEGATIVE
Comment: NEGATIVE
Comment: NEGATIVE
Comment: NEGATIVE
Comment: NEGATIVE
Comment: NORMAL
Neisseria Gonorrhea: NEGATIVE
Trichomonas: NEGATIVE

## 2021-01-08 ENCOUNTER — Telehealth: Payer: Self-pay | Admitting: Obstetrics

## 2021-01-08 ENCOUNTER — Other Ambulatory Visit: Payer: Self-pay | Admitting: Obstetrics

## 2021-01-08 DIAGNOSIS — N760 Acute vaginitis: Secondary | ICD-10-CM

## 2021-01-08 DIAGNOSIS — B9689 Other specified bacterial agents as the cause of diseases classified elsewhere: Secondary | ICD-10-CM

## 2021-01-08 MED ORDER — METRONIDAZOLE 0.75 % VA GEL: 1.0000 | Freq: Two times a day (BID) | VAGINAL | 0 refills | Status: DC

## 2021-01-08 MED ORDER — METRONIDAZOLE 500 MG PO TABS: 500.0000 mg | ORAL_TABLET | Freq: Two times a day (BID) | ORAL | 0 refills | Status: DC

## 2021-01-08 NOTE — Telephone Encounter (Signed)
Patient called requesting that her Metronidazole prescription be switched to pills instead of gell.   Rx routed to pharmacy.

## 2021-02-04 NOTE — Progress Notes (Signed)
NEUROLOGY FOLLOW UP OFFICE NOTE  Kaitlin Bryant [id number]  Assessment/Plan:   Pregnancy History of idiopathic intracranial hypertension Reports headaches/migraines, which may just be headaches aggravated by pregnancy  1  At this time, she needs a formal eye exam by ophthalmology to evaluate for active papilledema and assess visual fields.  She has been referred.  She will have the note faxed to me. 2  For frequent headaches, recommended magnesium oxide 43m daily, riboflavin 4043mdaily and CoQ10 10023mhree times daily 3  Tylenol as needed but limit use to no more than 2 days out of week to prevent risk of rebound or medication-overuse headache. 4  Follow up 6 months.   Subjective:  [name] a 30 50ar old female who follows up for idiopathic intracranial hypertension.   UPDATE: Last seen in June 2021.  At that time, she was off of acetazolamide and was to follow up with Dr. WeaKathlen Bryant ophthalmology.   She has been doing well.  No symptoms.  She is now [redacted] weeks pregnant.  Since pregnancy, she reports headaches, including migraine.  They are occurring 3 times a week.  She treats with Tylenol.  She hasn't had an eye exam since becoming pregnant.     HISTORY: For years, she reports pulsatile tinnitus, like a whooshing sound in her ears.  She has occasional headaches which are manageable.  She denies blurred vision or visual obscurations.  She typically has an eye exam every one or two years.  Her last exam was at most 2 years ago.  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  She does report having gained at least 20 lbs over the past year.  She is not on birth control.    MRI of brain/orbits with and without contrast and MRV of head from 11/14/2018 showed small nonspecific 4 mm T2/FLAIR signal focus in the dorsal left thalamus with no associated enhancement.  The pituitary infundibulum is deviated to left, raising possibility of  microadenoma but otherwise normal pituitary gland.  MRV showed effaced appearance of the bilateral transverse/sigmoid sinus junctions but no dural sinus thrombosis.    She underwent LP on 11/19/2018, which demonstrated an elevated opening pressure of 33 cm H2O.  CSF cell count, protein, glucose and Gram stain & culture negative.     She saw Dr. WeaKathlen Bryant ophthalmology on 02/21/2019. She did not demonstrate disc edema on exam.  Follow up with Dr. WeaKathlen Bryant 06/24/2019 and still did not demonstrate any papilledema.   PAST MEDICAL HISTORY: Past Medical History:  Diagnosis Date   Idiopathic intracranial hypertension     MEDICATIONS: Current Outpatient Medications on File Prior to Visit  Medication Sig Dispense Refill   Blood Pressure Monitoring (BLOOD PRESSURE KIT) DEVI 1 kit by Does not apply route once a week. 1 each 0   metroNIDAZOLE (FLAGYL) 500 MG tablet Take 1 tablet (500 mg total) by mouth 2 (two) times daily. 14 tablet 0   Prenat-Fe Poly-Methfol-FA-DHA (VITAFOL ULTRA) 29-0.6-0.4-200 MG CAPS Take 1 capsule by mouth daily. 30 capsule 11   No current facility-administered medications on file prior to visit.    ALLERGIES: No Known Allergies  FAMILY HISTORY: Family History  Problem Relation Age of Onset   Healthy Mother    Healthy Father    Healthy Sister    Healthy Brother    Diabetes Paternal Grandmother    Cancer - Colon Paternal Grandfather    Healthy Daughter  Objective:  Blood pressure 107/73, pulse 99, height 5' 3" (1.6 m), weight 248 lb 6.4 oz (112.7 kg), last menstrual period 09/27/2020, SpO2 100 %. General: No acute distress.  Patient appears well-groomed.   Head:  Normocephalic/atraumatic Eyes:  Fundi examined but not visualized Neck: supple, no paraspinal tenderness, full range of motion Heart:  Regular rate and rhythm Lungs:  Clear to auscultation bilaterally Back: No paraspinal tenderness Neurological Exam: alert and oriented to person, place, and time.   Speech fluent and not dysarthric, language intact.  CN II-XII intact. Bulk and tone normal, muscle strength 5/5 throughout.  Sensation to light touch intact.  Deep tendon reflexes 2+ throughout, toes downgoing.  Finger to nose testing intact.  Gait normal, Romberg negative.   Kaitlin Clines, DO  CC: Kaitlin Shields, MD

## 2021-02-08 ENCOUNTER — Other Ambulatory Visit: Payer: Self-pay

## 2021-02-08 ENCOUNTER — Ambulatory Visit (INDEPENDENT_AMBULATORY_CARE_PROVIDER_SITE_OTHER): Payer: Medicaid Other | Admitting: Neurology

## 2021-02-08 ENCOUNTER — Ambulatory Visit (INDEPENDENT_AMBULATORY_CARE_PROVIDER_SITE_OTHER): Payer: Medicaid Other | Admitting: Obstetrics and Gynecology

## 2021-02-08 ENCOUNTER — Encounter: Payer: Self-pay | Admitting: Neurology

## 2021-02-08 ENCOUNTER — Encounter: Payer: Self-pay | Admitting: Obstetrics and Gynecology

## 2021-02-08 VITALS — BP 112/75 | HR 101 | Wt 250.0 lb

## 2021-02-08 VITALS — BP 107/73 | HR 99 | Ht 63.0 in | Wt 248.4 lb

## 2021-02-08 DIAGNOSIS — Z3A18 18 weeks gestation of pregnancy: Secondary | ICD-10-CM | POA: Diagnosis not present

## 2021-02-08 DIAGNOSIS — O26892 Other specified pregnancy related conditions, second trimester: Secondary | ICD-10-CM

## 2021-02-08 DIAGNOSIS — Z8669 Personal history of other diseases of the nervous system and sense organs: Secondary | ICD-10-CM | POA: Diagnosis not present

## 2021-02-08 DIAGNOSIS — Z3491 Encounter for supervision of normal pregnancy, unspecified, first trimester: Secondary | ICD-10-CM

## 2021-02-08 DIAGNOSIS — R300 Dysuria: Secondary | ICD-10-CM

## 2021-02-08 DIAGNOSIS — Z23 Encounter for immunization: Secondary | ICD-10-CM

## 2021-02-08 NOTE — Patient Instructions (Signed)

## 2021-02-08 NOTE — Addendum Note (Signed)
Addended by: Maretta Bees on: 02/08/2021 02:00 PM   Modules accepted: Orders

## 2021-02-08 NOTE — Patient Instructions (Signed)
Follow up with ophthalmology to assess for swelling behind the eye and for vision loss.  Have note sent to me Consider magnesium oxide 400mg  daily, coenzyme Q10 100mg  three times daily and riboflavin 400mg  daily Limit use of pain relievers to no more than 2 days out of week to prevent risk of rebound or medication-overuse headache. Follow up in 6 months.

## 2021-02-08 NOTE — Progress Notes (Addendum)
ROB c/o migraines almost everyday 8/10. Tylenol does not work, dysuria.

## 2021-02-08 NOTE — Progress Notes (Signed)
Subjective:  Kaitlin Bryant is a 30 y.o. G2P1001 at [redacted]w[redacted]d being seen today for ongoing prenatal care.  She is currently monitored for the following issues for this high-risk pregnancy and has Encounter for supervision of normal pregnancy in first trimester; History of idiopathic intracranial hypertension; BMI 40.0-44.9, adult (HCC); and Obesity during pregnancy in first trimester on their problem list.  Patient reports headache.  Contractions: Not present. Vag. Bleeding: None.  Movement: Present. Denies leaking of fluid.   The following portions of the patient's history were reviewed and updated as appropriate: allergies, current medications, past family history, past medical history, past social history, past surgical history and problem list. Problem list updated.  Objective:   Vitals:   02/08/21 1315  BP: 112/75  Pulse: (!) 101  Weight: 250 lb (113.4 kg)    Fetal Status: Fetal Heart Rate (bpm): 151   Movement: Present     General:  Alert, oriented and cooperative. Patient is in no acute distress.  Skin: Skin is warm and dry. No rash noted.   Cardiovascular: Normal heart rate noted  Respiratory: Normal respiratory effort, no problems with respiration noted  Abdomen: Soft, gravid, appropriate for gestational age. Pain/Pressure: Absent     Pelvic:  Cervical exam deferred        Extremities: Normal range of motion.  Edema: None  Mental Status: Normal mood and affect. Normal behavior. Normal judgment and thought content.   Urinalysis:      Assessment and Plan:  Pregnancy: G2P1001 at [redacted]w[redacted]d  1. Encounter for supervision of normal pregnancy in first trimester, unspecified gravidity Stable Flu vaccine today Genetic and AFP today Anatomy scan scheduled   2. History of idiopathic intracranial hypertension Saw Neurology today Missed Opth appt will reschedule  Preterm labor symptoms and general obstetric precautions including but not limited to vaginal bleeding, contractions, leaking  of fluid and fetal movement were reviewed in detail with the patient. Please refer to After Visit Summary for other counseling recommendations.  Return in about 4 weeks (around 03/08/2021) for face to face, MD only, OB visit.   Hermina Staggers, MD

## 2021-02-10 LAB — AFP, SERUM, OPEN SPINA BIFIDA
AFP MoM: 0.87
AFP Value: 33.2 ng/mL
Gest. Age on Collection Date: 18.2 weeks
Maternal Age At EDD: 31.1 yr
OSBR Risk 1 IN: 10000
Test Results:: NEGATIVE
Weight: 250 [lb_av]

## 2021-02-10 LAB — URINE CULTURE

## 2021-02-15 ENCOUNTER — Encounter: Payer: Self-pay | Admitting: Obstetrics and Gynecology

## 2021-02-18 ENCOUNTER — Encounter: Payer: Self-pay | Admitting: Obstetrics and Gynecology

## 2021-02-24 ENCOUNTER — Ambulatory Visit: Payer: Medicaid Other | Admitting: *Deleted

## 2021-02-24 ENCOUNTER — Ambulatory Visit: Payer: Medicaid Other | Attending: Obstetrics and Gynecology

## 2021-02-24 ENCOUNTER — Other Ambulatory Visit: Payer: Self-pay | Admitting: *Deleted

## 2021-02-24 ENCOUNTER — Ambulatory Visit (HOSPITAL_BASED_OUTPATIENT_CLINIC_OR_DEPARTMENT_OTHER): Payer: Medicaid Other | Admitting: Obstetrics and Gynecology

## 2021-02-24 ENCOUNTER — Other Ambulatory Visit: Payer: Self-pay

## 2021-02-24 ENCOUNTER — Encounter: Payer: Self-pay | Admitting: *Deleted

## 2021-02-24 VITALS — BP 110/63 | HR 98

## 2021-02-24 DIAGNOSIS — O283 Abnormal ultrasonic finding on antenatal screening of mother: Secondary | ICD-10-CM | POA: Insufficient documentation

## 2021-02-24 DIAGNOSIS — Z3A20 20 weeks gestation of pregnancy: Secondary | ICD-10-CM | POA: Insufficient documentation

## 2021-02-24 DIAGNOSIS — D573 Sickle-cell trait: Secondary | ICD-10-CM | POA: Diagnosis present

## 2021-02-24 DIAGNOSIS — G932 Benign intracranial hypertension: Secondary | ICD-10-CM | POA: Insufficient documentation

## 2021-02-24 DIAGNOSIS — O99212 Obesity complicating pregnancy, second trimester: Secondary | ICD-10-CM | POA: Diagnosis present

## 2021-02-24 DIAGNOSIS — Z362 Encounter for other antenatal screening follow-up: Secondary | ICD-10-CM

## 2021-02-24 DIAGNOSIS — Z3491 Encounter for supervision of normal pregnancy, unspecified, first trimester: Secondary | ICD-10-CM | POA: Insufficient documentation

## 2021-02-24 DIAGNOSIS — Z8669 Personal history of other diseases of the nervous system and sense organs: Secondary | ICD-10-CM | POA: Insufficient documentation

## 2021-02-24 DIAGNOSIS — Z6841 Body Mass Index (BMI) 40.0 and over, adult: Secondary | ICD-10-CM

## 2021-02-24 DIAGNOSIS — O9902 Anemia complicating childbirth: Secondary | ICD-10-CM | POA: Diagnosis not present

## 2021-02-24 NOTE — Progress Notes (Signed)
Maternal-Fetal Medicine   Name: Kaitlin Bryant DOB: 10/14/1990 MRN: [id number] Referring Provider: Nettie Elm, MD  I had the pleasure of seeing Ms. Gehlhausen today at [facility]. She is G2 P1 at 20w 4d gestation and is here for fetal anatomy scan.  Past medical history significant for idiopathic intracranial hypertension diagnosed about 2 years ago.  She had lumbar puncture to relieve ocular pressure.  Patient was taking acetazolamide till July 2021 and discontinued on the recommendations of her ophthalmologist.  She does not have any headaches or pain in the eyes or diplopia now.  No history of hypertension or diabetes or any chronic medical conditions.  She has sickle cell trait and her partner had told her that he does not have sickle cell trait.  Past surgical history: Laparoscopic cholecystectomy (2013). Medications: Prenatal vitamins. Allergies: No known drug allergies. Social history: Denies tobacco or drug or alcohol use.  Her partner is an Tree surgeon and he is in good health.  He is not the father of her first child. Family history: No history of venous thromboembolism in the family.  Obstetric history significant for a term vaginal delivery of a female infant weighing 5 pounds at birth.  Patient reports her pregnancy was not complicated by fetal growth restriction. GYN history: No history of abnormal Pap smears or cervical surgeries.  No history of breast biopsies.  Her previous menstrual cycles were regular.  Ultrasound We performed a fetal anatomical survey.  Amniotic fluid is normal and good fetal activity seen.  Fetal biometry is consistent with the previously established dates.  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  No other markers of aneuploidies or fetal structural defects are seen.  As maternal obesity imposes limitations on resolution of ultrasound images, fetal anomalies can be missed.  Our concerns include Idiopathic intracranial hypertension  (IHI) Also known as pseudotumor cerebri is characterized increased CSF pressure (with normal composition) and absence of any intracranial lesions on neuroimaging. It most-commonly presents as headache. Other symptoms include retrobulbar pain, photopsia or diplopia. Visual loss can also occur, which although transient, can be permanent in about 20-25% of cases in the long run.   I reassured our patient that pregnancy does not worsen this condition in the short or long term. No increased visual loss rates have been reported in women who had pregnancies. IHI also does not adversely affect the pregnancy. No increase in miscarriages or preterm deliveries is reported. Treatment: Acetazolamide is the most-commonly used drug in this condition. It reduces the CSF production up to 50% in some cases, thereby, providing relief in symptoms. It is a category C drug. The [facility] found non increased congenital malformations in over 1024 women exposed to this drug. I reassured her that she may resume the medication after discussing with her neuroophthalmologist if she has symptoms.   Common side-effects include tingling in the hands and toes, metallic taste when carbonated drinks are consumed.  Breastfeeding: According to the American Academy of Pediatrics, acetazolamide is compatible with breastfeeding.   Regional anesthesia is safe and vaginal delivery is safe. Labor increases cardiac output and, consequently, increase CSF pressure. But this does not increase the risk for visual loss. Epidural analgesia with reasonable maternal effort in the second stage is safe. Outlet forceps or vacuum-assisted delivery may be required.  Absent nasal bone I explained that absent nasal bone is a marker for Down syndrome. I discussed the significance and limitations of cell-free fetal DNA screening. Given that the risk for Down  syndrome is not increased on cell-free fetal DNA screening, hypoplastic nasal bone  should be considered a normal variant. I informed her that this finding is more common in African American population.  Sickle-cell trait I discussed partner screening.  Patient reports her partner does not have sickle cell trait.  She is unlikely to carry a fetus with sickle cell disease.  Recommendations -An appointment was made for her to return in 4 weeks for completion of fetal anatomy. -Fetal growth assessment every 4 weeks till delivery.  Thank you for consultation.  If you have any questions or concerns, please contact me the [facility].  Consultation including face-to-face (more than 50%) counseling 30 minutes.

## 2021-03-08 ENCOUNTER — Encounter: Payer: Self-pay | Admitting: Obstetrics and Gynecology

## 2021-03-08 ENCOUNTER — Ambulatory Visit (INDEPENDENT_AMBULATORY_CARE_PROVIDER_SITE_OTHER): Payer: Medicaid Other | Admitting: Obstetrics and Gynecology

## 2021-03-08 ENCOUNTER — Other Ambulatory Visit: Payer: Self-pay

## 2021-03-08 VITALS — BP 115/75 | HR 99 | Wt 246.0 lb

## 2021-03-08 DIAGNOSIS — Z3481 Encounter for supervision of other normal pregnancy, first trimester: Secondary | ICD-10-CM

## 2021-03-08 DIAGNOSIS — Z8669 Personal history of other diseases of the nervous system and sense organs: Secondary | ICD-10-CM

## 2021-03-08 DIAGNOSIS — O99211 Obesity complicating pregnancy, first trimester: Secondary | ICD-10-CM

## 2021-03-08 MED ORDER — ASPIRIN EC 81 MG PO TBEC: 81.0000 mg | DELAYED_RELEASE_TABLET | Freq: Every day | ORAL | 2 refills | Status: DC

## 2021-03-08 NOTE — Progress Notes (Signed)
   PRENATAL VISIT NOTE  Subjective:  Kaitlin Bryant is a 30 y.o. G2P1001 at [redacted]w[redacted]d being seen today for ongoing prenatal care.  She is currently monitored for the following issues for this high-risk pregnancy and has Encounter for supervision of normal pregnancy in first trimester; History of idiopathic intracranial hypertension; BMI 40.0-44.9, adult (HCC); and Obesity during pregnancy in first trimester on their problem list.  Patient reports no complaints.  Contractions: Not present. Vag. Bleeding: None.  Movement: Present. Denies leaking of fluid.   The following portions of the patient's history were reviewed and updated as appropriate: allergies, current medications, past family history, past medical history, past social history, past surgical history and problem list.   Objective:   Vitals:   03/08/21 1044  BP: 115/75  Pulse: 99  Weight: 246 lb (111.6 kg)    Fetal Status: Fetal Heart Rate (bpm): 152 Fundal Height: 23 cm Movement: Present     General:  Alert, oriented and cooperative. Patient is in no acute distress.  Skin: Skin is warm and dry. No rash noted.   Cardiovascular: Normal heart rate noted  Respiratory: Normal respiratory effort, no problems with respiration noted  Abdomen: Soft, gravid, appropriate for gestational age.  Pain/Pressure: Present     Pelvic: Cervical exam deferred        Extremities: Normal range of motion.  Edema: None  Mental Status: Normal mood and affect. Normal behavior. Normal judgment and thought content.   Assessment and Plan:  Pregnancy: G2P1001 at [redacted]w[redacted]d 1. Encounter for supervision of other normal pregnancy in first trimester Patient is doing well without complaints Third trimester labs with glucola next visit  2. Obesity during pregnancy in first trimester Rx ASA provided Follow up growth ultrasound 11/21  3. History of idiopathic intracranial hypertension Seen by Neuro Stable  Preterm labor symptoms and general obstetric  precautions including but not limited to vaginal bleeding, contractions, leaking of fluid and fetal movement were reviewed in detail with the patient. Please refer to After Visit Summary for other counseling recommendations.   Return in about 4 weeks (around 04/05/2021) for in person, ROB, 2 hr glucola next visit.  Future Appointments  Date Time Provider Department Center  03/22/2021 10:30 AM [facility] NURSE [facility]  03/22/2021 10:45 AM [facility] US6 [facility]  08/24/2021  9:50 AM Everlena Cooper, Rachelle Hora, DO [facility] None    Catalina Antigua, MD

## 2021-03-10 ENCOUNTER — Telehealth: Payer: Self-pay | Admitting: Neurology

## 2021-03-10 NOTE — Telephone Encounter (Signed)
Called patient and informed her that Dr. Everlena Cooper will write her a letter stating she has been established patient from January 2021 to May 2021. Patient requested that we add Idiopathic Intracranial Hypertension. Letter has been created, printed, signed and placed up front for patient to pick up. Patient was informed of our office hours 7:30 am-4:30 pm and to pick up at the front desk. Patient had no further questions or concerns.

## 2021-03-10 NOTE — Telephone Encounter (Signed)
Patient was wondering if she could get a doctors note saying she was seen at our clinic January 2021-may 2021. She dropped her classes and she needs proof she was being seen

## 2021-03-22 ENCOUNTER — Ambulatory Visit: Payer: Medicaid Other

## 2021-04-05 ENCOUNTER — Ambulatory Visit (INDEPENDENT_AMBULATORY_CARE_PROVIDER_SITE_OTHER): Payer: Medicaid Other | Admitting: Obstetrics and Gynecology

## 2021-04-05 ENCOUNTER — Other Ambulatory Visit: Payer: Medicaid Other

## 2021-04-05 ENCOUNTER — Ambulatory Visit: Payer: Medicaid Other

## 2021-04-05 ENCOUNTER — Encounter: Payer: Self-pay | Admitting: Obstetrics and Gynecology

## 2021-04-05 ENCOUNTER — Other Ambulatory Visit: Payer: Self-pay

## 2021-04-05 VITALS — BP 108/73 | HR 92 | Wt 250.0 lb

## 2021-04-05 DIAGNOSIS — Z23 Encounter for immunization: Secondary | ICD-10-CM

## 2021-04-05 DIAGNOSIS — Z3481 Encounter for supervision of other normal pregnancy, first trimester: Secondary | ICD-10-CM

## 2021-04-05 DIAGNOSIS — O99212 Obesity complicating pregnancy, second trimester: Secondary | ICD-10-CM

## 2021-04-05 DIAGNOSIS — O99211 Obesity complicating pregnancy, first trimester: Secondary | ICD-10-CM

## 2021-04-05 DIAGNOSIS — Z3A26 26 weeks gestation of pregnancy: Secondary | ICD-10-CM

## 2021-04-05 DIAGNOSIS — Z8669 Personal history of other diseases of the nervous system and sense organs: Secondary | ICD-10-CM

## 2021-04-05 DIAGNOSIS — Z3480 Encounter for supervision of other normal pregnancy, unspecified trimester: Secondary | ICD-10-CM

## 2021-04-05 LAB — POCT URINALYSIS DIPSTICK
Bilirubin, UA: NEGATIVE
Blood, UA: NEGATIVE
Glucose, UA: NEGATIVE
Ketones, UA: NEGATIVE
Leukocytes, UA: NEGATIVE
Nitrite, UA: NEGATIVE
Protein, UA: NEGATIVE
Spec Grav, UA: 1.015 (ref 1.010–1.025)
Urobilinogen, UA: 0.2 E.U./dL
pH, UA: 6 (ref 5.0–8.0)

## 2021-04-05 NOTE — Progress Notes (Signed)
   PRENATAL VISIT NOTE  Subjective:  Kaitlin Bryant is a 30 y.o. G2P1001 at [redacted]w[redacted]d being seen today for ongoing prenatal care.  She is currently monitored for the following issues for this high-risk pregnancy and has Supervision of other normal pregnancy, antepartum; History of idiopathic intracranial hypertension; BMI 40.0-44.9, adult (HCC); and Obesity during pregnancy in first trimester on their problem list.  Patient reports no complaints.  Contractions: Not present. Vag. Bleeding: None.  Movement: Present. Denies leaking of fluid.   The following portions of the patient's history were reviewed and updated as appropriate: allergies, current medications, past family history, past medical history, past social history, past surgical history and problem list.   Objective:   Vitals:   04/05/21 0838  BP: 108/73  Pulse: 92  Weight: 250 lb (113.4 kg)    Fetal Status: Fetal Heart Rate (bpm): 143 Fundal Height: 26 cm Movement: Present     General:  Alert, oriented and cooperative. Patient is in no acute distress.  Skin: Skin is warm and dry. No rash noted.   Cardiovascular: Normal heart rate noted  Respiratory: Normal respiratory effort, no problems with respiration noted  Abdomen: Soft, gravid, appropriate for gestational age.  Pain/Pressure: Present     Pelvic: Cervical exam deferred        Extremities: Normal range of motion.  Edema: None  Mental Status: Normal mood and affect. Normal behavior. Normal judgment and thought content.   Assessment and Plan:  Pregnancy: G2P1001 at [redacted]w[redacted]d 1. Supervision of other normal pregnancy, antepartum   2. Encounter for supervision of other normal pregnancy in first trimester Patient is doing well without complaints Third trimester labs and glucola today Patient plans birth control pills for contraception  3. History of idiopathic intracranial hypertension Stable  4. Obesity during pregnancy in first trimester Continue ASA Follow up growth  ultrasound later today  Preterm labor symptoms and general obstetric precautions including but not limited to vaginal bleeding, contractions, leaking of fluid and fetal movement were reviewed in detail with the patient. Please refer to After Visit Summary for other counseling recommendations.   Return in about 2 weeks (around 04/19/2021) for in person, ROB, High risk.  Future Appointments  Date Time Provider Department Center  04/05/2021  9:35 AM Aadvika Konen, Gigi Gin, MD [facility] None  04/05/2021  1:00 PM [facility] NURSE [facility]  04/05/2021  1:15 PM [facility] US2 [facility]  08/24/2021  9:50 AM Everlena Cooper, Rachelle Hora, DO [facility] None    Catalina Antigua, MD

## 2021-04-05 NOTE — Addendum Note (Signed)
Addended by: Dalphine Handing on: 04/05/2021 09:29 AM   Modules accepted: Orders

## 2021-04-05 NOTE — Progress Notes (Signed)
Pt presents ROB, 2 gtt, and Tdap today.  PHQ9= 2 GAD7= 4

## 2021-04-06 ENCOUNTER — Ambulatory Visit: Payer: Medicaid Other | Admitting: *Deleted

## 2021-04-06 ENCOUNTER — Other Ambulatory Visit: Payer: Self-pay | Admitting: *Deleted

## 2021-04-06 ENCOUNTER — Ambulatory Visit: Payer: Medicaid Other | Attending: Obstetrics and Gynecology

## 2021-04-06 VITALS — BP 116/64 | HR 91

## 2021-04-06 DIAGNOSIS — E669 Obesity, unspecified: Secondary | ICD-10-CM | POA: Diagnosis not present

## 2021-04-06 DIAGNOSIS — Z6841 Body Mass Index (BMI) 40.0 and over, adult: Secondary | ICD-10-CM | POA: Diagnosis present

## 2021-04-06 DIAGNOSIS — Z3A26 26 weeks gestation of pregnancy: Secondary | ICD-10-CM

## 2021-04-06 DIAGNOSIS — O2690 Pregnancy related conditions, unspecified, unspecified trimester: Secondary | ICD-10-CM

## 2021-04-06 DIAGNOSIS — Z3480 Encounter for supervision of other normal pregnancy, unspecified trimester: Secondary | ICD-10-CM | POA: Insufficient documentation

## 2021-04-06 DIAGNOSIS — Q758 Other specified congenital malformations of skull and face bones: Secondary | ICD-10-CM

## 2021-04-06 DIAGNOSIS — O2692 Pregnancy related conditions, unspecified, second trimester: Secondary | ICD-10-CM | POA: Diagnosis not present

## 2021-04-06 DIAGNOSIS — O289 Unspecified abnormal findings on antenatal screening of mother: Secondary | ICD-10-CM | POA: Diagnosis not present

## 2021-04-06 DIAGNOSIS — Z862 Personal history of diseases of the blood and blood-forming organs and certain disorders involving the immune mechanism: Secondary | ICD-10-CM | POA: Diagnosis not present

## 2021-04-06 DIAGNOSIS — O99212 Obesity complicating pregnancy, second trimester: Secondary | ICD-10-CM

## 2021-04-06 DIAGNOSIS — Z362 Encounter for other antenatal screening follow-up: Secondary | ICD-10-CM | POA: Insufficient documentation

## 2021-04-06 LAB — CBC
Hematocrit: 29.8 % — ABNORMAL LOW (ref 34.0–46.6)
Hemoglobin: 10.2 g/dL — ABNORMAL LOW (ref 11.1–15.9)
MCH: 26.6 pg (ref 26.6–33.0)
MCHC: 34.2 g/dL (ref 31.5–35.7)
MCV: 78 fL — ABNORMAL LOW (ref 79–97)
Platelets: 321 10*3/uL (ref 150–450)
RBC: 3.83 x10E6/uL (ref 3.77–5.28)
RDW: 13.7 % (ref 11.7–15.4)
WBC: 10.7 10*3/uL (ref 3.4–10.8)

## 2021-04-06 LAB — GLUCOSE TOLERANCE, 2 HOURS W/ 1HR
Glucose, 1 hour: 120 mg/dL (ref 70–179)
Glucose, 2 hour: 81 mg/dL (ref 70–152)
Glucose, Fasting: 81 mg/dL (ref 70–91)

## 2021-04-06 LAB — HIV ANTIBODY (ROUTINE TESTING W REFLEX): HIV Screen 4th Generation wRfx: NONREACTIVE

## 2021-04-06 LAB — RPR: RPR Ser Ql: NONREACTIVE

## 2021-04-19 ENCOUNTER — Encounter: Payer: Medicaid Other | Admitting: Obstetrics and Gynecology

## 2021-05-02 NOTE — L&D Delivery Note (Signed)
OB/GYN Faculty Practice Delivery Note  Kaitlin Bryant is a 31 y.o. [id number] s/p SVD at [redacted]w[redacted]d. She was admitted for IOL due to oligohydramnios.   ROM: 5h 27m with clear fluid GBS Status: Negative   Delivery Date/Time: 06/20/21 at 1624  Delivery: Called to room and patient was complete and pushing. Head delivered direct OA. A shoulder dystocia was encountered and no additional traction was placed on the fetal head. McRobert's maneuver and suprapubic pressure were performed followed by delivery of the posterior arm which allowed for release of the anterior shoulder. No nuchal cord present. Shoulders and body then delivered in usual fashion. Total duration of shoulder dystocia about 1 minute. Cord clamped x 2 and cut immediately to allow for further resuscitation by neonatal team. Spontaneous cry noted with additional stimulation at warmer. Cord blood drawn. Placenta delivered spontaneously with gentle cord traction. Fundus firm with massage and Pitocin. Labia, perineum, vagina, and cervix were inspected, and no lacerations were noted.   Placenta: Intact, 3VC - sent to L&D Complications: Shoulder dystocia  Lacerations: None  EBL: 100 cc Analgesia: Epidural   Infant: Viable female   APGARs 8 and 9   Weight pending   Evalina Field, MD OB/GYN Fellow, Faculty Practice

## 2021-05-04 ENCOUNTER — Other Ambulatory Visit: Payer: Self-pay | Admitting: *Deleted

## 2021-05-04 ENCOUNTER — Ambulatory Visit: Payer: Medicaid Other | Admitting: *Deleted

## 2021-05-04 ENCOUNTER — Other Ambulatory Visit: Payer: Self-pay

## 2021-05-04 ENCOUNTER — Encounter: Payer: Medicaid Other | Admitting: Family Medicine

## 2021-05-04 ENCOUNTER — Ambulatory Visit: Payer: Medicaid Other | Attending: Obstetrics and Gynecology

## 2021-05-04 VITALS — BP 122/69 | HR 95

## 2021-05-04 DIAGNOSIS — E669 Obesity, unspecified: Secondary | ICD-10-CM

## 2021-05-04 DIAGNOSIS — O99212 Obesity complicating pregnancy, second trimester: Secondary | ICD-10-CM | POA: Insufficient documentation

## 2021-05-04 DIAGNOSIS — O99213 Obesity complicating pregnancy, third trimester: Secondary | ICD-10-CM

## 2021-05-04 DIAGNOSIS — Z3480 Encounter for supervision of other normal pregnancy, unspecified trimester: Secondary | ICD-10-CM | POA: Diagnosis present

## 2021-05-04 DIAGNOSIS — Z3A30 30 weeks gestation of pregnancy: Secondary | ICD-10-CM | POA: Insufficient documentation

## 2021-05-04 DIAGNOSIS — Z862 Personal history of diseases of the blood and blood-forming organs and certain disorders involving the immune mechanism: Secondary | ICD-10-CM | POA: Diagnosis not present

## 2021-05-04 DIAGNOSIS — O2693 Pregnancy related conditions, unspecified, third trimester: Secondary | ICD-10-CM | POA: Diagnosis not present

## 2021-05-04 DIAGNOSIS — Q758 Other specified congenital malformations of skull and face bones: Secondary | ICD-10-CM

## 2021-05-04 DIAGNOSIS — O289 Unspecified abnormal findings on antenatal screening of mother: Secondary | ICD-10-CM | POA: Diagnosis not present

## 2021-05-04 DIAGNOSIS — O2690 Pregnancy related conditions, unspecified, unspecified trimester: Secondary | ICD-10-CM | POA: Diagnosis present

## 2021-05-18 ENCOUNTER — Ambulatory Visit (INDEPENDENT_AMBULATORY_CARE_PROVIDER_SITE_OTHER): Payer: Medicaid Other | Admitting: Obstetrics & Gynecology

## 2021-05-18 ENCOUNTER — Other Ambulatory Visit: Payer: Self-pay

## 2021-05-18 VITALS — BP 111/73 | HR 108 | Wt 248.5 lb

## 2021-05-18 DIAGNOSIS — Z8669 Personal history of other diseases of the nervous system and sense organs: Secondary | ICD-10-CM

## 2021-05-18 DIAGNOSIS — Z6841 Body Mass Index (BMI) 40.0 and over, adult: Secondary | ICD-10-CM

## 2021-05-18 DIAGNOSIS — Z3480 Encounter for supervision of other normal pregnancy, unspecified trimester: Secondary | ICD-10-CM

## 2021-05-18 NOTE — Progress Notes (Signed)
ROB 32.3 wks Not taking ASA "yet", reinforced need for pre E prevention No unusual complaints.

## 2021-05-18 NOTE — Progress Notes (Signed)
° °  PRENATAL VISIT NOTE  Subjective:  Kaitlin Bryant is a 31 y.o. G2P1001 at [redacted]w[redacted]d being seen today for ongoing prenatal care.  She is currently monitored for the following issues for this high-risk pregnancy and has Supervision of other normal pregnancy, antepartum; History of idiopathic intracranial hypertension; BMI 40.0-44.9, adult ([place]); and Obesity during pregnancy in first trimester on their problem list.  Patient reports no complaints.  Contractions: Irritability. Vag. Bleeding: None.  Movement: Present. Denies leaking of fluid.   The following portions of the patient's history were reviewed and updated as appropriate: allergies, current medications, past family history, past medical history, past social history, past surgical history and problem list.   Objective:   Vitals:   05/18/21 0934  BP: 111/73  Pulse: (!) 108  Weight: 248 lb 8 oz (112.7 kg)    Fetal Status: Fetal Heart Rate (bpm): 147   Movement: Present     General:  Alert, oriented and cooperative. Patient is in no acute distress.  Skin: Skin is warm and dry. No rash noted.   Cardiovascular: Normal heart rate noted  Respiratory: Normal respiratory effort, no problems with respiration noted  Abdomen: Soft, gravid, appropriate for gestational age.  Pain/Pressure: Absent     Pelvic: Cervical exam deferred        Extremities: Normal range of motion.  Edema: None  Mental Status: Normal mood and affect. Normal behavior. Normal judgment and thought content.   Assessment and Plan:  Pregnancy: G2P1001 at [redacted]w[redacted]d 1. Supervision of other normal pregnancy, antepartum Normal fetal growth with f/u US in 2 weeks  2. History of idiopathic intracranial hypertension   3. BMI 40.0-44.9, adult (HCC) Body mass index is 44.02 kg/m.   Preterm labor symptoms and general obstetric precautions including but not limited to vaginal bleeding, contractions, leaking of fluid and fetal movement were reviewed in detail with the  patient. Please refer to After Visit Summary for other counseling recommendations.   Return in about 2 weeks (around 06/01/2021).  Future Appointments  Date Time Provider [place]  06/01/2021  9:30 AM [facility] NURSE [facility]  06/01/2021  9:45 AM [facility] US4 [facility]  06/01/2021 11:15 AM Woodroe Mode, MD [place] None  06/08/2021 10:45 AM [facility] NURSE [facility]  06/08/2021 11:00 AM [facility] US1 [facility]  08/24/2021  9:50 AM Pieter Partridge, DO [facility] None    Emeterio Reeve, MD

## 2021-05-23 ENCOUNTER — Encounter: Payer: Self-pay | Admitting: Obstetrics & Gynecology

## 2021-06-01 ENCOUNTER — Encounter: Payer: Self-pay | Admitting: *Deleted

## 2021-06-01 ENCOUNTER — Ambulatory Visit (HOSPITAL_BASED_OUTPATIENT_CLINIC_OR_DEPARTMENT_OTHER): Payer: Medicaid Other | Admitting: Maternal & Fetal Medicine

## 2021-06-01 ENCOUNTER — Other Ambulatory Visit: Payer: Self-pay

## 2021-06-01 ENCOUNTER — Ambulatory Visit: Payer: Medicaid Other | Attending: Obstetrics

## 2021-06-01 ENCOUNTER — Encounter (HOSPITAL_COMMUNITY): Payer: Self-pay | Admitting: Obstetrics and Gynecology

## 2021-06-01 ENCOUNTER — Ambulatory Visit: Payer: Medicaid Other | Admitting: *Deleted

## 2021-06-01 ENCOUNTER — Inpatient Hospital Stay (HOSPITAL_COMMUNITY)
Admission: AD | Admit: 2021-06-01 | Discharge: 2021-06-01 | Disposition: A | Discharge status: Home / Self Care | Payer: Medicaid Other | Attending: Obstetrics and Gynecology | Admitting: Obstetrics and Gynecology

## 2021-06-01 ENCOUNTER — Encounter: Payer: Medicaid Other | Admitting: Obstetrics & Gynecology

## 2021-06-01 VITALS — BP 124/68 | HR 95

## 2021-06-01 DIAGNOSIS — O283 Abnormal ultrasonic finding on antenatal screening of mother: Secondary | ICD-10-CM | POA: Insufficient documentation

## 2021-06-01 DIAGNOSIS — E668 Other obesity: Secondary | ICD-10-CM

## 2021-06-01 DIAGNOSIS — Z87891 Personal history of nicotine dependence: Secondary | ICD-10-CM | POA: Insufficient documentation

## 2021-06-01 DIAGNOSIS — O99013 Anemia complicating pregnancy, third trimester: Secondary | ICD-10-CM | POA: Diagnosis not present

## 2021-06-01 DIAGNOSIS — D573 Sickle-cell trait: Secondary | ICD-10-CM | POA: Diagnosis not present

## 2021-06-01 DIAGNOSIS — Z3A34 34 weeks gestation of pregnancy: Secondary | ICD-10-CM | POA: Insufficient documentation

## 2021-06-01 DIAGNOSIS — Z3689 Encounter for other specified antenatal screening: Secondary | ICD-10-CM

## 2021-06-01 DIAGNOSIS — O4100X0 Oligohydramnios, unspecified trimester, not applicable or unspecified: Secondary | ICD-10-CM

## 2021-06-01 DIAGNOSIS — Z6841 Body Mass Index (BMI) 40.0 and over, adult: Secondary | ICD-10-CM | POA: Insufficient documentation

## 2021-06-01 DIAGNOSIS — O4103X0 Oligohydramnios, third trimester, not applicable or unspecified: Secondary | ICD-10-CM

## 2021-06-01 DIAGNOSIS — Z3480 Encounter for supervision of other normal pregnancy, unspecified trimester: Secondary | ICD-10-CM

## 2021-06-01 DIAGNOSIS — Z3493 Encounter for supervision of normal pregnancy, unspecified, third trimester: Secondary | ICD-10-CM

## 2021-06-01 DIAGNOSIS — O99213 Obesity complicating pregnancy, third trimester: Secondary | ICD-10-CM | POA: Insufficient documentation

## 2021-06-01 LAB — WET PREP, GENITAL
Clue Cells Wet Prep HPF POC: NONE SEEN
Sperm: NONE SEEN
Trich, Wet Prep: NONE SEEN
WBC, Wet Prep HPF POC: >=10 — AB (ref <10)
Yeast Wet Prep HPF POC: NONE SEEN

## 2021-06-01 LAB — OB RESULTS CONSOLE GC/CHLAMYDIA: Gonorrhea: NEGATIVE

## 2021-06-01 LAB — AMNISURE RUPTURE OF MEMBRANE (ROM) NOT AT ARMC: Amnisure ROM: NEGATIVE

## 2021-06-01 NOTE — Progress Notes (Signed)
MFM Brief Note  Kaitlin Bryant is a 31 yo G2P1 who is here at 4 w 3 d for a follow up growth exam at the request of Dr. Emeterio Reeve.  Kaitlin Bryant's pregnancy is complicated by a BMI of 40.  A single intrauterine pregnancy was observed with measurements consistent with dates. There is good fetal movement. However today the amniotic fluid appears subjectively low.  I discussed today's findings with Kaitlin Bryant and she expressed that she felt that she has been leaking fluid and has had to wear a pad.  She denies s/sx of infection or labor.  I recommended that she go to the MAU for further evaluation.  I spoke with Mallie Snooks who is aware of her coming.  If she is deemed that her membranes are intact she will continue weekly testing. She will also was instructed for hydration.  I spent 20 minutes with > 50% in face to face consultation.  Vikki Ports, MD.

## 2021-06-01 NOTE — MAU Note (Signed)
Pt states she went to regular pregnancy check up at , per pt the Doctor stated that "fluid surrounding is not the amount it's supposed to be". Pt states she wears panty liners and "sometimes they are wet", pt states she changes panty liners 2xs in 24h. Pt states clear discharge, denies foul odor. Pt states last intercourse was 05/31/21 night.
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
## 2021-06-01 NOTE — MAU Note (Signed)
Sent from clinic.  ? Leaking vs urine or sweat,  fluid low on [place].  Sent for R/O ROM. Cramping, no bleeding. +FM

## 2021-06-02 LAB — GC/CHLAMYDIA PROBE AMP (~~LOC~~) NOT AT ARMC
Chlamydia: NEGATIVE
Comment: NEGATIVE
Comment: NORMAL
Neisseria Gonorrhea: NEGATIVE

## 2021-06-08 ENCOUNTER — Ambulatory Visit: Payer: Medicaid Other | Attending: Obstetrics

## 2021-06-08 ENCOUNTER — Encounter: Payer: Self-pay | Admitting: *Deleted

## 2021-06-08 ENCOUNTER — Other Ambulatory Visit: Payer: Self-pay

## 2021-06-08 ENCOUNTER — Ambulatory Visit: Payer: Medicaid Other | Admitting: *Deleted

## 2021-06-08 VITALS — BP 109/64 | HR 114

## 2021-06-08 DIAGNOSIS — O99213 Obesity complicating pregnancy, third trimester: Secondary | ICD-10-CM | POA: Insufficient documentation

## 2021-06-08 DIAGNOSIS — Z8669 Personal history of other diseases of the nervous system and sense organs: Secondary | ICD-10-CM | POA: Diagnosis not present

## 2021-06-08 DIAGNOSIS — Z3480 Encounter for supervision of other normal pregnancy, unspecified trimester: Secondary | ICD-10-CM | POA: Insufficient documentation

## 2021-06-08 DIAGNOSIS — O4103X0 Oligohydramnios, third trimester, not applicable or unspecified: Secondary | ICD-10-CM | POA: Insufficient documentation

## 2021-06-08 DIAGNOSIS — Z3A35 35 weeks gestation of pregnancy: Secondary | ICD-10-CM | POA: Diagnosis not present

## 2021-06-08 DIAGNOSIS — O289 Unspecified abnormal findings on antenatal screening of mother: Secondary | ICD-10-CM | POA: Diagnosis not present

## 2021-06-08 DIAGNOSIS — E668 Other obesity: Secondary | ICD-10-CM

## 2021-06-08 DIAGNOSIS — Z862 Personal history of diseases of the blood and blood-forming organs and certain disorders involving the immune mechanism: Secondary | ICD-10-CM | POA: Insufficient documentation

## 2021-06-11 ENCOUNTER — Ambulatory Visit (INDEPENDENT_AMBULATORY_CARE_PROVIDER_SITE_OTHER): Payer: Medicaid Other | Admitting: Obstetrics and Gynecology

## 2021-06-11 ENCOUNTER — Encounter: Payer: Self-pay | Admitting: Obstetrics and Gynecology

## 2021-06-11 ENCOUNTER — Other Ambulatory Visit: Payer: Self-pay

## 2021-06-11 VITALS — BP 126/83 | HR 108 | Wt 247.0 lb

## 2021-06-11 DIAGNOSIS — O4103X0 Oligohydramnios, third trimester, not applicable or unspecified: Secondary | ICD-10-CM

## 2021-06-11 DIAGNOSIS — O4100X0 Oligohydramnios, unspecified trimester, not applicable or unspecified: Secondary | ICD-10-CM | POA: Insufficient documentation

## 2021-06-11 DIAGNOSIS — Z8669 Personal history of other diseases of the nervous system and sense organs: Secondary | ICD-10-CM

## 2021-06-11 DIAGNOSIS — Z3480 Encounter for supervision of other normal pregnancy, unspecified trimester: Secondary | ICD-10-CM

## 2021-06-11 NOTE — Patient Instructions (Signed)
Vaginal Delivery ?Vaginal delivery means that you give birth by pushing your baby out of your birth canal (vagina). Your health care team will help you before, during, and after vaginal delivery. ?Birth experiences are unique for every woman and every pregnancy, and birth experiences vary depending on where you choose to give birth. ?What are the risks and benefits? ?Generally, this is safe. However, problems may occur, including: ?Bleeding. ?Infection. ?Damage to other structures such as vaginal tearing. ?Allergic reactions to medicines. ?Despite the risks, benefits of vaginal delivery include less risk of bleeding and infection and a shorter recovery time compared to a Cesarean delivery. Cesarean delivery, or C-section, is the surgical delivery of a baby. ?What happens when I arrive at the birth center or hospital? ?Once you are in labor and have been admitted into the hospital or birth center, your health care team may: ?Review your pregnancy history and any concerns that you have. ?Talk with you about your birth plan and discuss pain control options. ?Check your blood pressure, breathing, and heartbeat. ?Assess your baby's heartbeat. ?Monitor your uterus for contractions. ?Check whether your bag of water (amniotic sac) has broken (ruptured). ?Insert an IV into one of your veins. This may be used to give you fluids and medicines. ?Monitoring ?Your health care team may assess your contractions (uterine monitoring) and your baby's heart rate (fetal monitoring). You may need to be monitored: ?Often, but not continuously (intermittently). ?All the time or for long periods at a time (continuously). Continuous monitoring may be needed if: ?You are taking certain medicines, such as medicine to relieve pain or make your contractions stronger. ?You have pregnancy or labor complications. ?Monitoring may be done by: ?Placing a special stethoscope or a handheld monitoring device on your abdomen to check your baby's heartbeat  and to check for contractions. ?Placing monitors on your abdomen (external monitors) to record your baby's heartbeat and the frequency and length of contractions. ?Placing monitors inside your uterus through your vagina (internal monitors) to record your baby's heartbeat and the frequency, length, and strength of your contractions. Depending on the type of monitor, it may remain in your uterus or on your baby's head until birth. ?Telemetry. This is a type of continuous monitoring that can be done with external or internal monitors. Instead of having to stay in bed, you are able to move around. ?Physical exam ?Your health care team may perform frequent physical exams. This may include: ?Checking how and where your baby is positioned in your uterus. ?Checking your cervix to determine: ?Whether it is thinning out (effacing). ?Whether it is opening up (dilating). ?What happens during labor and delivery? ?Normal labor and delivery is divided into the following three stages: ?Stage 1 ?This is the longest stage of labor. ?Throughout this stage, you will feel contractions. Contractions generally feel mild, infrequent, and irregular at first. They get stronger, more frequent, and more regular as you move through this stage. You may have contractions about every 2-3 minutes. ?This stage ends when your cervix is completely dilated to 4 inches (10 cm) and completely effaced. ?Stage 2 ?This stage starts once your cervix is completely effaced and dilated and lasts until the delivery of your baby. ?This is the stage where you will feel an urge to push your baby out of your vagina. ?You may feel stretching and burning pain, especially when the widest part of your baby's head passes through the vaginal opening (crowning). ?Once your baby is delivered, the umbilical cord will be clamped and   cut. Timing of cutting the cord will depend on your wishes, your baby's health, and your health care provider's practices. ?Your baby will be  placed on your bare chest (skin-to-skin contact) in an upright position and covered with a warm blanket. If you are choosing to breastfeed, watch your baby for feeding cues, like rooting or sucking, and help the baby to your breast for his or her first feeding. ?Stage 3 ?This stage starts immediately after the birth of your baby and ends after you deliver the placenta. ?This stage may take anywhere from 5 to 30 minutes. ?After your baby has been delivered, you will feel contractions as your body expels the placenta. These contractions also help your uterus get smaller and reduce bleeding. ?What can I expect after labor and delivery? ?After labor is over, you and your baby will be assessed closely until you are ready to go home. Your health care team will teach you how to care for yourself and your baby. ?You and your baby may be encouraged to stay in the same room (rooming in) during your hospital stay. This will help promote early bonding and successful breastfeeding. ?Your uterus will be checked and massaged regularly (fundal massage). ?You may continue to receive fluids and medicines through an IV. ?You will have some soreness and pain in your abdomen, vagina, and the area of skin between your vaginal opening and your anus (perineum). ?If an incision was made near your vagina (episiotomy) or if you had some vaginal tearing during delivery, cold compresses may be placed on your episiotomy or your tear. This helps to reduce pain and swelling. ?It is normal to have vaginal bleeding after delivery. Wear a sanitary pad for vaginal bleeding and discharge. ?Summary ?Vaginal delivery means that you will give birth by pushing your baby out of your birth canal (vagina). ?Your health care team will monitor you and your baby throughout the stages of labor. ?After you deliver your baby, your health care team will continue to assess you and your baby to ensure you are both recovering as expected after delivery. ?This  information is not intended to replace advice given to you by your health care provider. Make sure you discuss any questions you have with your health care provider. ?Document Revised: 03/16/2020 Document Reviewed: 03/16/2020 ?Elsevier Patient Education ? 2022 Elsevier Inc. ? ?

## 2021-06-11 NOTE — Progress Notes (Signed)
Subjective:  Kaitlin Bryant is a 31 y.o. G2P1001 at [redacted]w[redacted]d being seen today for ongoing prenatal care.  She is currently monitored for the following issues for this high-risk pregnancy and has Supervision of other normal pregnancy, antepartum; History of idiopathic intracranial hypertension; BMI 40.0-44.9, adult ([place]); Obesity during pregnancy in first trimester; and Oligohydramnios on their problem list.  Patient reports general discomforts of pregnancy.  Contractions: Irritability. Vag. Bleeding: None.  Movement: Present. Denies leaking of fluid.   The following portions of the patient's history were reviewed and updated as appropriate: allergies, current medications, past family history, past medical history, past social history, past surgical history and problem list. Problem list updated.  Objective:   Vitals:   06/11/21 0859  BP: 126/83  Pulse: (!) 108  Weight: 247 lb (112 kg)    Fetal Status:     Movement: Present     General:  Alert, oriented and cooperative. Patient is in no acute distress.  Skin: Skin is warm and dry. No rash noted.   Cardiovascular: Normal heart rate noted  Respiratory: Normal respiratory effort, no problems with respiration noted  Abdomen: Soft, gravid, appropriate for gestational age. Pain/Pressure: Absent     Pelvic:  Cervical exam performed        Extremities: Normal range of motion.  Edema: None  Mental Status: Normal mood and affect. Normal behavior. Normal judgment and thought content.   Urinalysis:      Assessment and Plan:  Pregnancy: G2P1001 at [redacted]w[redacted]d  1. Supervision of other normal pregnancy, antepartum Stable Labor precautions  2. History of idiopathic intracranial hypertension Stable Serial growth scans and weekly antenatal testing as per MFM  3. Oligohydramnios in third trimester, single or unspecified fetus F/U BPP next week  Preterm labor symptoms and general obstetric precautions including but not limited to vaginal bleeding,  contractions, leaking of fluid and fetal movement were reviewed in detail with the patient. Please refer to After Visit Summary for other counseling recommendations.  Return in about 1 week (around 06/18/2021) for OB visit, face to face, MD only.   [name], MD

## 2021-06-11 NOTE — Addendum Note (Signed)
Addended by: Dalphine Handing on: 06/11/2021 10:26 AM   Modules accepted: Orders

## 2021-06-11 NOTE — Progress Notes (Signed)
Pt presents for ROB and GBS. GC/CT negative on 06/01/21 PHQ9=1 GAD7= 6 - pt declined counseling services

## 2021-06-13 LAB — STREP GP B NAA: Strep Gp B NAA: NEGATIVE

## 2021-06-16 ENCOUNTER — Ambulatory Visit (INDEPENDENT_AMBULATORY_CARE_PROVIDER_SITE_OTHER): Payer: Medicaid Other

## 2021-06-16 ENCOUNTER — Other Ambulatory Visit: Payer: Self-pay

## 2021-06-16 ENCOUNTER — Other Ambulatory Visit: Payer: Self-pay | Admitting: Advanced Practice Midwife

## 2021-06-16 ENCOUNTER — Ambulatory Visit (INDEPENDENT_AMBULATORY_CARE_PROVIDER_SITE_OTHER): Payer: Medicaid Other | Admitting: General Practice

## 2021-06-16 VITALS — BP 112/70 | HR 109

## 2021-06-16 DIAGNOSIS — O4103X0 Oligohydramnios, third trimester, not applicable or unspecified: Secondary | ICD-10-CM

## 2021-06-16 DIAGNOSIS — Z3A36 36 weeks gestation of pregnancy: Secondary | ICD-10-CM

## 2021-06-16 NOTE — Progress Notes (Signed)
Pt informed that the ultrasound is considered a limited OB ultrasound and is not intended to be a complete ultrasound exam.  Patient also informed that the ultrasound is not being completed with the intent of assessing for fetal or placental anomalies or any pelvic abnormalities.  Explained that the purpose of todays ultrasound is to assess for  BPP, presentation, and AFI.  Patient acknowledges the purpose of the exam and the limitations of the study.     Patient's AFI today is 4.96, down from 8 last week. Called Dr Harolyn Rutherford to discuss, who recommends IOL at 37 weeks on 2/18. Scheduled with birthing suites. Discussed with patient & advised if she notices decrease in FM to go to MAU. Patient verbalized understanding.  Koren Bound RN BSN  06/16/21

## 2021-06-16 NOTE — Addendum Note (Signed)
Addended by: Jaynie Collins A on: 06/16/2021 02:52 PM   Modules accepted: Orders

## 2021-06-17 ENCOUNTER — Other Ambulatory Visit: Payer: Self-pay | Admitting: Family Medicine

## 2021-06-17 ENCOUNTER — Telehealth (HOSPITAL_COMMUNITY): Payer: Self-pay | Admitting: *Deleted

## 2021-06-17 ENCOUNTER — Encounter (HOSPITAL_COMMUNITY): Payer: Self-pay | Admitting: *Deleted

## 2021-06-17 ENCOUNTER — Encounter (HOSPITAL_COMMUNITY): Payer: Self-pay

## 2021-06-17 NOTE — Telephone Encounter (Signed)
Preadmission screen  

## 2021-06-18 LAB — SARS CORONAVIRUS 2 (TAT 6-24 HRS): SARS Coronavirus 2: NEGATIVE

## 2021-06-19 ENCOUNTER — Encounter (HOSPITAL_COMMUNITY): Payer: Self-pay | Admitting: Obstetrics & Gynecology

## 2021-06-19 ENCOUNTER — Other Ambulatory Visit: Payer: Self-pay

## 2021-06-19 ENCOUNTER — Inpatient Hospital Stay (HOSPITAL_COMMUNITY)
Admission: RE | Admit: 2021-06-19 | Discharge: 2021-06-21 | DRG: 807 | Disposition: A | Discharge status: Home / Self Care | Payer: Medicaid Other | Attending: Obstetrics and Gynecology | Admitting: Obstetrics and Gynecology

## 2021-06-19 ENCOUNTER — Inpatient Hospital Stay (HOSPITAL_COMMUNITY): Payer: Medicaid Other

## 2021-06-19 DIAGNOSIS — Z3480 Encounter for supervision of other normal pregnancy, unspecified trimester: Secondary | ICD-10-CM

## 2021-06-19 DIAGNOSIS — Z3A37 37 weeks gestation of pregnancy: Secondary | ICD-10-CM

## 2021-06-19 DIAGNOSIS — O9902 Anemia complicating childbirth: Secondary | ICD-10-CM | POA: Diagnosis present

## 2021-06-19 DIAGNOSIS — O4103X9 Oligohydramnios, third trimester, other fetus: Secondary | ICD-10-CM | POA: Diagnosis present

## 2021-06-19 DIAGNOSIS — D573 Sickle-cell trait: Secondary | ICD-10-CM | POA: Diagnosis present

## 2021-06-19 DIAGNOSIS — O4103X0 Oligohydramnios, third trimester, not applicable or unspecified: Principal | ICD-10-CM | POA: Diagnosis present

## 2021-06-19 DIAGNOSIS — O99214 Obesity complicating childbirth: Secondary | ICD-10-CM | POA: Diagnosis present

## 2021-06-19 DIAGNOSIS — Z87891 Personal history of nicotine dependence: Secondary | ICD-10-CM | POA: Diagnosis not present

## 2021-06-19 DIAGNOSIS — Z6841 Body Mass Index (BMI) 40.0 and over, adult: Secondary | ICD-10-CM

## 2021-06-19 DIAGNOSIS — O4103X1 Oligohydramnios, third trimester, fetus 1: Secondary | ICD-10-CM | POA: Diagnosis not present

## 2021-06-19 DIAGNOSIS — Z8669 Personal history of other diseases of the nervous system and sense organs: Secondary | ICD-10-CM

## 2021-06-19 DIAGNOSIS — O4100X0 Oligohydramnios, unspecified trimester, not applicable or unspecified: Secondary | ICD-10-CM | POA: Diagnosis present

## 2021-06-19 DIAGNOSIS — Z8759 Personal history of other complications of pregnancy, childbirth and the puerperium: Secondary | ICD-10-CM

## 2021-06-19 LAB — CBC
HCT: 27.5 % — ABNORMAL LOW (ref 36.0–46.0)
Hemoglobin: 9.2 g/dL — ABNORMAL LOW (ref 12.0–15.0)
MCH: 25.4 pg — ABNORMAL LOW (ref 26.0–34.0)
MCHC: 33.5 g/dL (ref 30.0–36.0)
MCV: 76 fL — ABNORMAL LOW (ref 80.0–100.0)
Platelets: 302 10*3/uL (ref 150–400)
RBC: 3.62 MIL/uL — ABNORMAL LOW (ref 3.87–5.11)
RDW: 13.5 % (ref 11.5–15.5)
WBC: 11.1 10*3/uL — ABNORMAL HIGH (ref 4.0–10.5)
nRBC: 0 % (ref 0.0–0.2)

## 2021-06-19 LAB — TYPE AND SCREEN
ABO/RH(D): O POS
Antibody Screen: NEGATIVE

## 2021-06-19 MED ORDER — LACTATED RINGERS IV SOLN: 500.0000 mL | INTRAVENOUS | Status: DC | PRN

## 2021-06-19 MED ORDER — OXYTOCIN-SODIUM CHLORIDE 30-0.9 UT/500ML-% IV SOLN: 2.5000 [IU]/h | INTRAVENOUS | Status: DC

## 2021-06-19 MED ORDER — OXYCODONE-ACETAMINOPHEN 5-325 MG PO TABS: 1.0000 | ORAL_TABLET | ORAL | Status: DC | PRN

## 2021-06-19 MED ORDER — LACTATED RINGERS IV SOLN
INTRAVENOUS | Status: DC
  Administered 2021-06-20: 125 mL/h via INTRAVENOUS

## 2021-06-19 MED ORDER — FLEET ENEMA 7-19 GM/118ML RE ENEM: 1.0000 | ENEMA | Freq: Every day | RECTAL | Status: DC | PRN

## 2021-06-19 MED ORDER — OXYTOCIN BOLUS FROM INFUSION: 333.0000 mL | Freq: Once | INTRAVENOUS | Status: DC

## 2021-06-19 MED ORDER — ZOLPIDEM TARTRATE 5 MG PO TABS: 5.0000 mg | ORAL_TABLET | Freq: Every evening | ORAL | Status: DC | PRN

## 2021-06-19 MED ORDER — TERBUTALINE SULFATE 1 MG/ML IJ SOLN: 0.2500 mg | Freq: Once | INTRAMUSCULAR | Status: DC | PRN

## 2021-06-19 MED ORDER — ONDANSETRON HCL 4 MG/2ML IJ SOLN: 4.0000 mg | Freq: Four times a day (QID) | INTRAMUSCULAR | Status: DC | PRN

## 2021-06-19 MED ORDER — SOD CITRATE-CITRIC ACID 500-334 MG/5ML PO SOLN: 30.0000 mL | ORAL | Status: DC | PRN

## 2021-06-19 MED ORDER — MISOPROSTOL 25 MCG QUARTER TABLET
25.0000 ug | ORAL_TABLET | ORAL | Status: DC | PRN
  Filled 2021-06-19: qty 1

## 2021-06-19 MED ORDER — MISOPROSTOL 50MCG HALF TABLET
50.0000 ug | ORAL_TABLET | ORAL | Status: DC
  Administered 2021-06-19 – 2021-06-20 (×4): 50 ug via BUCCAL
  Filled 2021-06-19 (×3): qty 1

## 2021-06-19 MED ORDER — HYDROXYZINE HCL 50 MG PO TABS
50.0000 mg | ORAL_TABLET | Freq: Four times a day (QID) | ORAL | Status: DC | PRN
  Administered 2021-06-19: 50 mg via ORAL
  Filled 2021-06-19: qty 1

## 2021-06-19 MED ORDER — LIDOCAINE HCL (PF) 1 % IJ SOLN: 30.0000 mL | INTRAMUSCULAR | Status: DC | PRN

## 2021-06-19 MED ORDER — ACETAMINOPHEN 325 MG PO TABS: 650.0000 mg | ORAL_TABLET | ORAL | Status: DC | PRN

## 2021-06-19 MED ORDER — OXYCODONE-ACETAMINOPHEN 5-325 MG PO TABS: 2.0000 | ORAL_TABLET | ORAL | Status: DC | PRN

## 2021-06-19 MED ORDER — FENTANYL CITRATE (PF) 100 MCG/2ML IJ SOLN
50.0000 ug | INTRAMUSCULAR | Status: DC | PRN
  Administered 2021-06-20 (×6): 100 ug via INTRAVENOUS
  Filled 2021-06-19 (×6): qty 2

## 2021-06-19 MED ORDER — OXYTOCIN-SODIUM CHLORIDE 30-0.9 UT/500ML-% IV SOLN
1.0000 m[IU]/min | INTRAVENOUS | Status: DC
  Administered 2021-06-20: 2 m[IU]/min via INTRAVENOUS
  Filled 2021-06-19: qty 500

## 2021-06-19 NOTE — Progress Notes (Signed)
Kaitlin Bryant is a 31 y.o. G2P1001 at [redacted]w[redacted]d admitted for induction of labor due to oligohydramnios.  Subjective: Patient reports feeling some cramping but not a lot. Ok with foley balloon placement when able to get it  Objective: BP (!) 118/41    Pulse 94    Temp 97.6 F (36.4 C) (Axillary)    Resp 16    Ht 5\' 4"  (1.626 m)    Wt 113.2 kg    LMP 09/27/2020    BMI 42.83 kg/m  No intake/output data recorded. No intake/output data recorded.  FHT:  FHR: 150 bpm, variability: moderate,  accelerations:  Present,  decelerations:  Absent UC:   irregular SVE:   Dilation: Closed Effacement (%): Thick Station: Ballotable Exam by:: Dr [email or web address] (verified by ultrasound)  Labs: Lab Results  Component Value Date   WBC 11.1 (H) 06/19/2021   HGB 9.2 (L) 06/19/2021   HCT 27.5 (L) 06/19/2021   MCV 76.0 (L) 06/19/2021   PLT 302 06/19/2021    Assessment / Plan: G2P1001 at [redacted]w[redacted]d admitted for induction of labor due to oligohydramnios.  Labor:  now s/p 2 cytotec. Cervix still closed/long/high. Will give additional cytotec now. At next check will assess for placement of foley balloon - will try with speculum guidance if appropriate/necessary. Fetal Wellbeing:  Category I I/D:   GBS negative   #Hx of ICH Patient reports diagnosed with ICH/pseudotumor cerebri ~2 years ago. Workup done due to intractable headaches with MRI in 10/2018 confirming diagnosis. Follow with neurologist Dr. 12/2018. Last seen in 03/2021. At the time having headaches that she was given Mg Oxide, Riboflavin, and CoQ for. Currently reports she does not have any headaches. Also in 03/2021 was referred to optho to assess for papilledema but records not in Epic. Per patient no papilledema was detected.  - At this time no headaches - will continue to monitor for symptoms - plan to labor down when complete to minimize pushing time however given asymptomatic likely can be managed as usual    04/2021 06/19/2021, 9:34 PM

## 2021-06-19 NOTE — Progress Notes (Signed)
Labor Progress Note Kaitlin Bryant is a 31 y.o. G2P1001 at [redacted]w[redacted]d presented for IOL d/t oligo.  S: Doing well, feeling some occasional cramping   O:  BP (!) 105/59    Pulse 87    Resp 16    Ht 5\' 4"  (1.626 m)    Wt 113.2 kg    LMP 09/27/2020    BMI 42.83 kg/m  EFM: 150/mod/15x15/none  CVE: Dilation: Closed Effacement (%): Thick Station: Ballotable Presentation: Vertex Exam by:: Florita Nitsch   A&P: 31 y.o. G2P1001 [redacted]w[redacted]d  #Labor: Re-confirmed cephalic with BSUS. Opted to defer cervical exam, unlikely to change management at this time and would not expect region to open enough to FB placement quite yet (patient is on the fence about having this placed as well). Plan to give second cytotec shortly.  #Pain: PRN  #FWB: Cat I  #GBS negative  [redacted]w[redacted]d, DO 4:53 PM
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
## 2021-06-20 ENCOUNTER — Inpatient Hospital Stay (HOSPITAL_COMMUNITY): Payer: Medicaid Other | Admitting: Anesthesiology

## 2021-06-20 ENCOUNTER — Encounter (HOSPITAL_COMMUNITY): Payer: Self-pay | Admitting: Obstetrics & Gynecology

## 2021-06-20 DIAGNOSIS — O4103X1 Oligohydramnios, third trimester, fetus 1: Secondary | ICD-10-CM

## 2021-06-20 DIAGNOSIS — Z3A37 37 weeks gestation of pregnancy: Secondary | ICD-10-CM

## 2021-06-20 LAB — RPR: RPR Ser Ql: NONREACTIVE

## 2021-06-20 MED ORDER — DIBUCAINE (PERIANAL) 1 % EX OINT: 1.0000 "application " | TOPICAL_OINTMENT | CUTANEOUS | Status: DC | PRN

## 2021-06-20 MED ORDER — EPHEDRINE 5 MG/ML INJ: 10.0000 mg | INTRAVENOUS | Status: DC | PRN

## 2021-06-20 MED ORDER — LACTATED RINGERS IV SOLN
500.0000 mL | Freq: Once | INTRAVENOUS | Status: AC
  Administered 2021-06-20: 500 mL via INTRAVENOUS

## 2021-06-20 MED ORDER — TRANEXAMIC ACID-NACL 1000-0.7 MG/100ML-% IV SOLN: 1000.0000 mg | INTRAVENOUS | Status: AC

## 2021-06-20 MED ORDER — FENTANYL-BUPIVACAINE-NACL 0.5-0.125-0.9 MG/250ML-% EP SOLN
EPIDURAL | Status: AC
  Filled 2021-06-20: qty 250

## 2021-06-20 MED ORDER — ONDANSETRON HCL 4 MG PO TABS: 4.0000 mg | ORAL_TABLET | ORAL | Status: DC | PRN

## 2021-06-20 MED ORDER — PHENYLEPHRINE 40 MCG/ML (10ML) SYRINGE FOR IV PUSH (FOR BLOOD PRESSURE SUPPORT)
80.0000 ug | PREFILLED_SYRINGE | INTRAVENOUS | Status: DC | PRN
  Filled 2021-06-20: qty 10

## 2021-06-20 MED ORDER — WITCH HAZEL-GLYCERIN EX PADS: 1.0000 "application " | MEDICATED_PAD | CUTANEOUS | Status: DC | PRN

## 2021-06-20 MED ORDER — SENNOSIDES-DOCUSATE SODIUM 8.6-50 MG PO TABS
2.0000 | ORAL_TABLET | Freq: Every day | ORAL | Status: DC
  Administered 2021-06-21: 2 via ORAL
  Filled 2021-06-20: qty 2

## 2021-06-20 MED ORDER — DIPHENHYDRAMINE HCL 25 MG PO CAPS: 25.0000 mg | ORAL_CAPSULE | Freq: Four times a day (QID) | ORAL | Status: DC | PRN

## 2021-06-20 MED ORDER — DIPHENHYDRAMINE HCL 50 MG/ML IJ SOLN: 12.5000 mg | INTRAMUSCULAR | Status: DC | PRN

## 2021-06-20 MED ORDER — PRENATAL MULTIVITAMIN CH
1.0000 | ORAL_TABLET | Freq: Every day | ORAL | Status: DC
  Administered 2021-06-21: 1 via ORAL
  Filled 2021-06-20: qty 1

## 2021-06-20 MED ORDER — LIDOCAINE HCL (PF) 1 % IJ SOLN
INTRAMUSCULAR | Status: DC | PRN
  Administered 2021-06-20: 8 mL via EPIDURAL

## 2021-06-20 MED ORDER — ONDANSETRON HCL 4 MG/2ML IJ SOLN: 4.0000 mg | INTRAMUSCULAR | Status: DC | PRN

## 2021-06-20 MED ORDER — ACETAMINOPHEN 325 MG PO TABS
650.0000 mg | ORAL_TABLET | ORAL | Status: DC | PRN
  Administered 2021-06-21: 650 mg via ORAL
  Filled 2021-06-20: qty 2

## 2021-06-20 MED ORDER — SIMETHICONE 80 MG PO CHEW: 80.0000 mg | CHEWABLE_TABLET | ORAL | Status: DC | PRN

## 2021-06-20 MED ORDER — BENZOCAINE-MENTHOL 20-0.5 % EX AERO: 1.0000 "application " | INHALATION_SPRAY | CUTANEOUS | Status: DC | PRN

## 2021-06-20 MED ORDER — TRANEXAMIC ACID-NACL 1000-0.7 MG/100ML-% IV SOLN
INTRAVENOUS | Status: AC
  Administered 2021-06-20: 1000 mg
  Filled 2021-06-20: qty 100

## 2021-06-20 MED ORDER — PHENYLEPHRINE 40 MCG/ML (10ML) SYRINGE FOR IV PUSH (FOR BLOOD PRESSURE SUPPORT): 80.0000 ug | PREFILLED_SYRINGE | INTRAVENOUS | Status: DC | PRN

## 2021-06-20 MED ORDER — IBUPROFEN 600 MG PO TABS
600.0000 mg | ORAL_TABLET | Freq: Four times a day (QID) | ORAL | Status: DC
  Administered 2021-06-20 – 2021-06-21 (×4): 600 mg via ORAL
  Filled 2021-06-20 (×4): qty 1

## 2021-06-20 MED ORDER — FENTANYL-BUPIVACAINE-NACL 0.5-0.125-0.9 MG/250ML-% EP SOLN
12.0000 mL/h | EPIDURAL | Status: DC | PRN
  Administered 2021-06-20: 12 mL/h via EPIDURAL

## 2021-06-20 MED ORDER — COCONUT OIL OIL: 1.0000 "application " | TOPICAL_OIL | Status: DC | PRN

## 2021-06-20 NOTE — Anesthesia Procedure Notes (Signed)
Epidural Patient location during procedure: OB Start time: 06/20/2021 8:00 AM End time: 06/20/2021 8:11 AM  Staffing Anesthesiologist: Merlinda Frederick, MD Performed: anesthesiologist   Preanesthetic Checklist Completed: patient identified, IV checked, site marked, risks and benefits discussed, monitors and equipment checked, pre-op evaluation and timeout performed  Epidural Patient position: sitting Prep: DuraPrep Patient monitoring: heart rate, cardiac monitor, continuous pulse ox and blood pressure Approach: midline Location: L2-L3 Injection technique: LOR saline  Needle:  Needle type: Tuohy  Needle gauge: 17 G Needle length: 9 cm Needle insertion depth: 8 cm Catheter type: closed end flexible Catheter size: 20 Guage Catheter at skin depth: 13 cm Test dose: negative and Other  Assessment Events: blood not aspirated, injection not painful, no injection resistance and negative IV test  Additional Notes Informed consent obtained prior to proceeding including risk of failure, 1% risk of PDPH, risk of minor discomfort and bruising.  Discussed rare but serious complications including epidural abscess, permanent nerve injury, epidural hematoma.  Discussed alternatives to epidural analgesia and patient desires to proceed.  Timeout performed pre-procedure verifying patient name, procedure, and platelet count.  Patient tolerated procedure well.

## 2021-06-20 NOTE — Lactation Note (Signed)
This note was copied from a baby's chart. Lactation Consultation Note  Patient Name: Kaitlin Bryant IZTIW'P Date: 06/20/2021 Reason for consult: L&D Initial assessment;Mother's request;Early term 37-38.6wks;Breastfeeding assistance Age:31 hours  LC assisted with latching with signs of milk transfer. Infant still feeding at the end of the visit.  Mom feeding plan to EBF.  Mom to receive further LC support on the floor.   Maternal Data Has patient been taught Hand Expression?: Yes Does the patient have breastfeeding experience prior to this delivery?: Yes How long did the patient breastfeed?: 2 months  Feeding Mother's Current Feeding Choice: Breast Milk  LATCH Score Latch: Repeated attempts needed to sustain latch, nipple held in mouth throughout feeding, stimulation needed to elicit sucking reflex.  Audible Swallowing: Spontaneous and intermittent  Type of Nipple: Everted at rest and after stimulation  Comfort (Breast/Nipple): Soft / non-tender  Hold (Positioning): Assistance needed to correctly position infant at breast and maintain latch.  LATCH Score: 8   Lactation Tools Discussed/Used    Interventions Interventions: Breast feeding basics reviewed;Assisted with latch;Skin to skin;Breast massage;Hand express;Breast compression;Adjust position;Support pillows;Expressed milk;Education;Visual merchandiser education  Discharge WIC Program: Yes  Consult Status Consult Status: Follow-up from L&D Date: 06/21/21 Follow-up type: In-patient    [place] Nabor  Nicholson-Springer 06/20/2021, 5:43 PM

## 2021-06-20 NOTE — Progress Notes (Signed)
Labor Progress Note Kaitlin Bryant is a 31 y.o. G2P1001 at [redacted]w[redacted]d who presented for IOL due to oligohydramnios.   S: Doing well. Comfortable with epidural. Family at bedside. No concerns at this time.   O:  BP 98/78    Pulse 79    Temp 98 F (36.7 C) (Oral)    Resp 16    Ht 5\' 4"  (1.626 m)    Wt 113.2 kg    LMP 09/27/2020    BMI 42.83 kg/m   EFM: Baseline 135 bpm, moderate variability, + accels, early/variable decels  Toco: Every 2-4 minutes  CVE: Dilation: 4.5 Effacement (%): 90 Station: -2 Presentation: Vertex Exam by:: Dr. [email or web address]  A&P: 31 y.o. G2P1001 [redacted]w[redacted]d   #Labor: Progressing well. Significant change in effacement since last exam, similar dilation. SROM noted at 1036 per RN with clear fluid. Contracting every 2-4 minutes. Will start Pitocin 2x2 and reassess in 3-4 hours.  #Pain: Epidural  #FWB: Cat 2 due to occasional variable decels. Continues to have reassuring variability and accels. Will continue to monitor.  #GBS negative  [redacted]w[redacted]d, MD 12:15 PM

## 2021-06-20 NOTE — Progress Notes (Signed)
Kaitlin Bryant is a 31 y.o. G2P1001 at [redacted]w[redacted]d admitted for induction of labor due to oligohydramnios.  Patient having more frequent contractions. Starting to get more uncomfortable. Received Fentanyl for pain relief. Cooks balloon still in place.   Reviewed tracing: Baseline: 135, moderate variability, no accels, no decels Contractions: 1-2 min.   A/P Given contracting too much for cytotec will hold off on next dose for now. Cooks balloon still in place. Discussed with RN regarding plan and plan to tug on balloon an hour from last tug.  Warner Mccreedy, MD, MPH OB Fellow, Faculty Practice

## 2021-06-20 NOTE — Discharge Summary (Addendum)
Postpartum Discharge Summary   Patient Name: Kaitlin Bryant DOB: 1990-11-04 MRN: [id number]  Date of admission: 06/19/2021 Delivery date:06/20/2021  Delivering provider: Genia Del  Date of discharge: 06/21/2021  Admitting diagnosis: Oligohydramnios antepartum, third trimester, other fetus [O41.03X9] Intrauterine pregnancy: [redacted]w[redacted]d    Secondary diagnosis:  Principal Problem:   Oligohydramnios antepartum, third trimester, other fetus Active Problems:   Supervision of other normal pregnancy, antepartum   History of idiopathic intracranial hypertension   BMI 40.0-44.9, adult ([place]   Oligohydramnios  Additional problems: None    Discharge diagnosis: Term Pregnancy Delivered                                              Post partum procedures:None Augmentation: Pitocin, Cytotec, and IP Foley Complications: Shoulder dystocia   Hospital course: Induction of Labor With Vaginal Delivery   31y.o. yo G2P1001 at 353w1das admitted to the hospital 06/19/2021 for induction of labor.  Indication for induction:  Oligohydramnios .  Patient had an uncomplicated labor course and progressed to complete after Cytotec, Cook's catheter placement, SROM, and briefly Pitocin. She had a vaginal delivery complicated by a shoulder dystocia.  Membrane Rupture Time/Date: 10:36 AM ,06/20/2021   Delivery Method:Vaginal, Spontaneous  Episiotomy: None  Lacerations:  None  Details of delivery can be found in separate delivery note.  Patient had a routine postpartum course.  She is eating, drinking, ambulating, voiding, and passing flatus without issue.  Patient is discharged home 06/21/21.  Newborn Data: Birth date:06/20/2021  Birth time:4:24 PM  Gender:Female  Living status:Living  Apgars:8 ,9  [id number]   Magnesium Sulfate received: No BMZ received: No Rhophylac: N/A MMR: N/A T-DaP: Given prenatally Flu: Given prenatally  Transfusion: No   Physical exam  Vitals:   06/20/21 2000 06/21/21 0000  06/21/21 0400 06/21/21 1207  BP: 118/70 112/64 119/76 117/82  Pulse: 89 85 85 82  Resp: 16 16 16 17   Temp: 98.1 F (36.7 C) 98 F (36.7 C) 98.4 F (36.9 C) 97.9 F (36.6 C)  TempSrc: Oral Oral Oral Oral  SpO2: 100% 100% 100% 100%  Weight:      Height:       General: alert, cooperative, and no distress Lochia: appropriate, some dime size clots that are decreasing in quantity Uterine Fundus: firm Incision: N/A DVT Evaluation: No evidence of DVT seen on physical exam. No cords or calf tenderness. No significant calf/ankle edema.  Labs: Lab Results  Component Value Date   WBC 11.1 (H) 06/19/2021   HGB 9.2 (L) 06/19/2021   HCT 27.5 (L) 06/19/2021   MCV 76.0 (L) 06/19/2021   PLT 302 06/19/2021   CMP Latest Ref Rng & Units 11/22/2018  Glucose 65 - 99 mg/dL 102(H)  BUN 7 - 25 mg/dL 14  Creatinine 0.50 - 1.10 mg/dL 0.98  Sodium 135 - 146 mmol/L 137  Potassium 3.5 - 5.3 mmol/L 4.0  Chloride 98 - 110 mmol/L 109  CO2 20 - 32 mmol/L 18(L)  Calcium 8.6 - 10.2 mg/dL 9.4   Edinburgh Score: Edinburgh Postnatal Depression Scale Screening Tool 06/21/2021  I have been able to laugh and see the funny side of things. 0  I have looked forward with enjoyment to things. 0  I have blamed myself unnecessarily when things went wrong. 2  I have been anxious or worried for no good reason. 1  I have felt scared or panicky for no good reason. 0  Things have been getting on top of me. 0  I have been so unhappy that I have had difficulty sleeping. 0  I have felt sad or miserable. 0  I have been so unhappy that I have been crying. 0  The thought of harming myself has occurred to me. 0  Edinburgh Postnatal Depression Scale Total 3     After visit meds:  Allergies as of 06/21/2021   No Known Allergies      Medication List     STOP taking these medications    aspirin EC 81 MG tablet       TAKE these medications    acetaminophen 325 MG tablet Commonly known as: Tylenol Take 2  tablets (650 mg total) by mouth every 4 (four) hours as needed (for pain scale < 4).   Blood Pressure Kit Devi 1 kit by Does not apply route once a week.   coconut oil Oil Apply 1 application topically as needed.   ibuprofen 600 MG tablet Commonly known as: ADVIL Take 1 tablet (600 mg total) by mouth every 6 (six) hours.   Vitafol Ultra 29-0.6-0.4-200 MG Caps Take 1 capsule by mouth daily.         Discharge home in stable condition Infant Feeding: Breast Infant Disposition:home with mother Discharge instruction: per After Visit Summary and Postpartum booklet. Activity: Advance as tolerated. Pelvic rest for 6 weeks.  Diet: routine diet Future Appointments: Future Appointments  Date Time Provider [place]  07/06/2021 10:55 AM Woodroe Mode, MD [facility] None  08/02/2021  1:10 PM [name], MD [place] None  08/24/2021  9:50 AM Pieter Partridge, DO [facility] None   Follow up Visit: Message sent to [place] by Dr. Gwenlyn Perking on 06/20/21.   Please schedule this patient for a In person postpartum visit in 6 weeks with the following provider: Any provider. Additional Postpartum F/U: Appointment in 2 weeks for interval BTL consult  Low risk pregnancy complicated by:  Oligohydramnios, IIH Delivery mode:  Vaginal, Spontaneous  Anticipated Birth Control:  Pills > Interval BTL  06/21/2021 Gerrit Heck, MD   Attestation of CNM Supervision of Resident: Evaluation and management procedures were performed by the [facility] Resident under my supervision. I was immediately available for direct supervision, assistance and direction throughout this encounter.  I also confirm that I have verified the information documented in the residents note, and that I have also personally reperformed the pertinent components of the physical exam and all of the medical decision making activities.  I have also made any necessary editorial changes.  Renee Harder, CNM 06/22/2021 12:58  PM

## 2021-06-20 NOTE — Progress Notes (Signed)
Unable to trace contractions well with external monitor. IUPC placed this check without difficulty. 5/90/-2 by SVE. Mom and baby tolerated this well, good scalp stimulation.   Contraction pattern adequate. On 2 milli-units/min of Pitocin.   Deep early/variable decelerations with contractions, worse since starting Pitocin. Will stop Pitocin for now and reassess. Plan to start amnioinfusion if deep variables persist.   -Evalina Field, MD

## 2021-06-20 NOTE — Lactation Note (Signed)
This note was copied from a baby's chart. Lactation Consultation Note Mom stated baby BF well after delivery but hasn't been interested since. Newborn behavior and feeding habits discussed. Discussed w/mom d/t baby's glucose 40 needs to give baby something. LC hand expressed colostrum in spoon. Discussed mom to use DEBP. Mom agreed. Encouraged to pump for supplementation and stimulation d/t less than 6 lbs. In case baby needs more supplement mom agrees to give [facility] if needed. Attempted baby to the breast the baby had one good burst of suckling like he was going to BF then he stopped.  Mom shown how to use DEBP & how to disassemble, clean, & reassemble parts. Mom knows to pump q3h for 15-20 min.mom pumping when LC left to write note. Will go back to see if has enough colostrum to give baby.  Mom encouraged to feed baby 8-12 times/24 hours and with feeding cues.   Encouraged STS and I&O. Call for needed assistance.  Patient Name: Kaitlin Bryant [id number] Date: 06/20/2021 Reason for consult: Initial assessment Age:46 hours  Maternal Data Has patient been taught Hand Expression?: Yes Does the patient have breastfeeding experience prior to this delivery?: Yes How long did the patient breastfeed?: 2 months  Feeding Mother's Current Feeding Choice: Breast Milk  LATCH Score Latch: Too sleepy or reluctant, no latch achieved, no sucking elicited.  Audible Swallowing: None  Type of Nipple: Everted at rest and after stimulation  Comfort (Breast/Nipple): Soft / non-tender  Hold (Positioning): Full assist, staff holds infant at breast  LATCH Score: 4   Lactation Tools Discussed/Used Tools: Pump Breast pump type: Double-Electric Breast Pump Pump Education: Setup, frequency, and cleaning;Milk Storage Reason for Pumping: less than 6 lbs Pumping frequency: q3hr  Interventions Interventions: Breast feeding basics reviewed;Assisted with latch;Skin to skin;Breast massage;Hand express;Breast  compression;Adjust position;Support pillows;Position options;Expressed milk;DEBP;LC Services brochure  Discharge WIC Program: Yes  Consult Status Consult Status: Follow-up Date: 06/20/21 Follow-up type: In-patient    Charyl Dancer 06/20/2021, 9:13 PM

## 2021-06-20 NOTE — Progress Notes (Signed)
Kaitlin Bryant is a 31 y.o. G2P1001 at [redacted]w[redacted]d admitted for induction of labor due to oligohydramnios.  Subjective: Starting to feel contractions more. Ok with placing balloon Objective: BP 118/73    Pulse 81    Temp 97.6 F (36.4 C) (Axillary)    Resp 17    Ht 5\' 4"  (1.626 m)    Wt 113.2 kg    LMP 09/27/2020    BMI 42.83 kg/m  No intake/output data recorded. No intake/output data recorded.  FHT:  FHR: 145-150 bpm, variability: moderate,  accelerations:  Present,  decelerations:  Absent UC:   irregular SVE:   Dilation: 1 Effacement (%): 50 Station: Ballotable Exam by:: Dr Cy Blamer  Labs: Lab Results  Component Value Date   WBC 11.1 (H) 06/19/2021   HGB 9.2 (L) 06/19/2021   HCT 27.5 (L) 06/19/2021   MCV 76.0 (L) 06/19/2021   PLT 302 06/19/2021    Assessment / Plan: G2P1001 at [redacted]w[redacted]d admitted for induction of labor due to oligohydramnios.  Labor:  cervix now 1 cm after 3 cytotec. Cooks balloon placed with 80cc in uterine ballon. Cytotec #4 given buccally.  Fetal Wellbeing:  Category I Pain Control:  IV pain meds I/D:   GBS negative  Renard Matter 06/20/2021, 2:51 AM

## 2021-06-20 NOTE — Anesthesia Preprocedure Evaluation (Addendum)
Anesthesia Evaluation  Patient identified by MRN, date of birth, ID band Patient awake    Reviewed: Allergy & Precautions, NPO status , Patient's Chart, lab work & pertinent test results  Airway Mallampati: II  TM Distance: >3 FB Neck ROM: Full    Dental no notable dental hx.    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 Patient stopped taking acetazolamide on her own. 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  Reproductive/Obstetrics negative OB ROS                            Anesthesia Physical Anesthesia Plan  ASA: 3  Anesthesia Plan: Epidural   Post-op Pain Management:    Induction:   PONV Risk Score and Plan: 2 and Treatment may vary due to age or medical condition  Airway Management Planned: Natural Airway  Additional Equipment:   Intra-op Plan:   Post-operative Plan:   Informed Consent: I have reviewed the patients History and Physical, chart, labs and discussed the procedure including the risks, benefits and alternatives for the proposed anesthesia with the patient or authorized representative who has indicated his/her understanding and acceptance.       Plan Discussed with: Anesthesiologist  Anesthesia Plan Comments:         Anesthesia Quick Evaluation

## 2021-06-21 ENCOUNTER — Encounter: Payer: Medicaid Other | Admitting: Obstetrics & Gynecology

## 2021-06-21 LAB — BIRTH TISSUE RECOVERY COLLECTION (PLACENTA DONATION)

## 2021-06-21 MED ORDER — IBUPROFEN 600 MG PO TABS: 600.0000 mg | ORAL_TABLET | Freq: Four times a day (QID) | ORAL | 0 refills | Status: DC

## 2021-06-21 MED ORDER — COCONUT OIL OIL: 1.0000 "application " | TOPICAL_OIL | 0 refills | Status: DC | PRN

## 2021-06-21 MED ORDER — ACETAMINOPHEN 325 MG PO TABS: 650.0000 mg | ORAL_TABLET | ORAL | 0 refills | Status: AC | PRN

## 2021-06-21 NOTE — Lactation Note (Addendum)
This note was copied from a baby's chart. Lactation Consultation Note  Patient Name: Kaitlin Bryant [id number]'J Date: 06/21/2021 Reason for consult: Follow-up assessment;Early term 37-38.6wks;Infant < 6lbs Age:31 hours  P2, Baby being circumcised.  Assisted mother with hand expression with good flow and pumping. Provided mother with paperwork for DEBP loaner since [facility] is closed today.  Family states they are being discharged today.  Encouraged mother to continue pumping q 3 hours. Reviewed milk storage guidelines and volume guidelines. Mother pumped 5 ml and will give the difference with donor milk. Mother knows once home she may need to supplement with formula until her milk volume has increased.  Reviewed engorgement care and monitoring voids/stools.    Feeding Mother's Current Feeding Choice: Breast Milk and Donor Milk Nipple Type: Slow - flow   Lactation Tools Discussed/Used Tools: Pump Breast pump type: Double-Electric Breast Pump Pump Education: Milk Storage Reason for Pumping: stimulation and supplementation Pumping frequency: q 3 hours  Interventions Interventions: Breast feeding basics reviewed;DEBP;Education  Discharge Pump: WIC Loaner WIC Program: Yes  Consult Status Consult Status: Follow-up Date: 06/21/21 Follow-up type: In-patient    Dahlia Byes [facility] 06/21/2021, 12:49 PM

## 2021-06-21 NOTE — Anesthesia Postprocedure Evaluation (Signed)
Anesthesia Post Note  Patient: Film/video editor  Procedure(s) Performed: AN AD HOC LABOR EPIDURAL     Patient location during evaluation: Mother Baby Anesthesia Type: Epidural Level of consciousness: awake and alert and oriented Pain management: satisfactory to patient Vital Signs Assessment: post-procedure vital signs reviewed and stable Respiratory status: respiratory function stable Cardiovascular status: stable Postop Assessment: no headache, no backache, epidural receding, patient able to bend at knees, no signs of nausea or vomiting, adequate PO intake and able to ambulate Anesthetic complications: no   No notable events documented.  Last Vitals:  Vitals:   06/21/21 0000 06/21/21 0400  BP: 112/64 119/76  Pulse: 85 85  Resp: 16 16  Temp: 36.7 C 36.9 C  SpO2: 100% 100%    Last Pain:  Vitals:   06/21/21 0750  TempSrc:   PainSc: 0-No pain   Pain Goal:                   Kaitlin Bryant

## 2021-06-21 NOTE — Plan of Care (Signed)
Pt recovering well this shift. All checks WDL. VS stable. Pain well-controlled on oral medications. Pt walking independently. Pt requires some assistance with breastfeeding as baby is 37 weeks and 0 days. Following LPI guidelines for feeding and supplementation. Pt pumping after every feed and feeding at least every 3 hours. Supplementing baby with donor milk. Continue to monitor and support as needed.

## 2021-06-21 NOTE — Progress Notes (Addendum)
POSTPARTUM PROGRESS NOTE  Post Partum Day 1  Subjective:  Kaitlin Bryant is a 31 y.o. [id number] s/p SVD at [redacted]w[redacted]d.  She reports she is doing well. No acute events overnight. She denies any problems with ambulating, voiding or po intake. Denies nausea or vomiting.  Pain is well controlled.  Lochia is present with dime sized clots but improved.  Objective: Blood pressure 119/76, pulse 85, temperature 98.4 F (36.9 C), temperature source Oral, resp. rate 16, height 5\' 4"  (1.626 m), weight 113.2 kg, last menstrual period 09/27/2020, SpO2 100 %, unknown if currently breastfeeding.  Physical Exam:  General: alert, cooperative and no distress Chest: no respiratory distress Heart:regular rate, distal pulses intact Abdomen: soft, nontender,  Uterine Fundus: firm, appropriately tender DVT Evaluation: No calf swelling or tenderness Extremities: no pitting edema Skin: warm, dry  Recent Labs    06/19/21 1053  HGB 9.2*  HCT 27.5*    Assessment/Plan: Kaitlin Bryant is a 31 y.o. 38 s/p SVD at [redacted]w[redacted]d   PPD#1 - Doing well  Routine postpartum care  Contraception: pills before interim BTL Feeding: Breast and bottle. Milk supply less but she has been offering breast before bottle and pumping as well. Lactation following. Desires circumcision for baby inpatient Dispo: Plan for discharge likely tomorrow.   LOS: 2 days   [redacted]w[redacted]d, MD  FM PGY-1 06/21/2021, 7:01 AM    Attestation of CNM Supervision of Resident: Evaluation and management procedures were performed by the [facility] Medicine Resident under my supervision. I was immediately available for direct supervision, assistance and direction throughout this encounter.  I also confirm that I have verified the information documented in the residents note, and that I have also personally reperformed the pertinent components of the physical exam and all of the medical decision making activities.  I have also made any necessary editorial  changes.  [facility], CNM 06/21/2021 7:54 AM

## 2021-06-23 DIAGNOSIS — Z8759 Personal history of other complications of pregnancy, childbirth and the puerperium: Secondary | ICD-10-CM

## 2021-06-24 ENCOUNTER — Other Ambulatory Visit: Payer: Medicaid Other

## 2021-06-30 ENCOUNTER — Telehealth (HOSPITAL_COMMUNITY): Payer: Self-pay | Admitting: *Deleted

## 2021-06-30 NOTE — Telephone Encounter (Signed)
Voicemail is full, so no message left. ? ?Kaitlin Bryant, [place] 06-30-2021 at 10:21am ?

## 2021-07-01 ENCOUNTER — Other Ambulatory Visit: Payer: Medicaid Other

## 2021-07-06 ENCOUNTER — Institutional Professional Consult (permissible substitution): Payer: Medicaid Other | Admitting: Obstetrics & Gynecology

## 2021-08-02 ENCOUNTER — Ambulatory Visit (INDEPENDENT_AMBULATORY_CARE_PROVIDER_SITE_OTHER): Payer: Medicaid Other | Admitting: Obstetrics and Gynecology

## 2021-08-02 ENCOUNTER — Encounter: Payer: Self-pay | Admitting: Obstetrics and Gynecology

## 2021-08-02 ENCOUNTER — Ambulatory Visit (INDEPENDENT_AMBULATORY_CARE_PROVIDER_SITE_OTHER): Payer: Medicaid Other | Admitting: Licensed Clinical Social Worker

## 2021-08-02 DIAGNOSIS — F419 Anxiety disorder, unspecified: Secondary | ICD-10-CM | POA: Diagnosis not present

## 2021-08-02 DIAGNOSIS — O99345 Other mental disorders complicating the puerperium: Secondary | ICD-10-CM

## 2021-08-02 DIAGNOSIS — Z30013 Encounter for initial prescription of injectable contraceptive: Secondary | ICD-10-CM

## 2021-08-02 DIAGNOSIS — Z309 Encounter for contraceptive management, unspecified: Secondary | ICD-10-CM | POA: Insufficient documentation

## 2021-08-02 DIAGNOSIS — F418 Other specified anxiety disorders: Secondary | ICD-10-CM

## 2021-08-02 MED ORDER — SERTRALINE HCL 50 MG PO TABS: 50.0000 mg | ORAL_TABLET | Freq: Every day | ORAL | 5 refills | Status: AC

## 2021-08-02 MED ORDER — MEDROXYPROGESTERONE ACETATE 150 MG/ML IM SUSP
150.0000 mg | Freq: Once | INTRAMUSCULAR | Status: AC
  Administered 2021-08-02: 150 mg via INTRAMUSCULAR

## 2021-08-02 NOTE — Progress Notes (Signed)
? ? ?Post Partum Visit Note ? ?Kaitlin Bryant is a 31 y.o. [id number] female who presents for a postpartum visit. She is 6 weeks postpartum following a normal spontaneous vaginal delivery.  I have fully reviewed the prenatal and intrapartum course. The delivery was at 37.1 gestational weeks.  Anesthesia: epidural. Postpartum course has been unremarkable. Baby is doing well. Baby is feeding by bottle - Gerber Gentle . Bleeding no bleeding. Bowel function is normal. Bladder function is normal. Patient is not sexually active. Contraception method is abstinence. Pt is interested in [name]. ? ?Postpartum depression screening: negative, score 6. ?GAD score 8.  ?Pt has been having panic attacks, first in November and has had 2 since delivery. ? ? ?The pregnancy intention screening data noted above was reviewed. Potential methods of contraception were discussed. The patient elected to proceed with No data recorded. ? ? ? ?Health Maintenance Due  ?Topic Date Due  ? COVID-19 Vaccine (1) Never done  ? ? ?Medical record ? ?Review of Systems ?Pertinent items noted in HPI and remainder of comprehensive ROS otherwise negative. ? ?Objective:  ?LMP 09/27/2020   ? ?General:  alert  ? Breasts:  not indicated  ?Lungs: clear to auscultation bilaterally  ?Heart:  regular rate and rhythm, S1, S2 normal, no murmur, click, rub or gallop  ?Abdomen: soft, non-tender; bowel sounds normal; no masses,  no organomegaly   ?Wound NA  ?GU exam:  not indicated  ?     ?Assessment:  ? ? There are no diagnoses linked to this encounter. ? ?NL postpartum exam.  ?Contraception ?Anxiety/depression ? ?Plan:  ? ?Essential components of care per ACOG recommendations: ? ?1.  Mood and well being: Patient with positive depression screening today. Reviewed local resources for support.  ?- Patient tobacco use? No.   ?- hx of drug use? No.   ? ?2. Infant care and feeding:  ?-Patient currently breastmilk feeding? No.  ?-Social determinants of health (SDOH) reviewed in  EPIC. No concerns ? ?3. Sexuality, contraception and birth spacing ?- Patient  does not  want a pregnancy in the next year.  Desired family size is completed children.  ?- Reviewed reproductive life planning. Reviewed contraceptive methods based on pt preferences and effectiveness.  Patient desired Female Sterilization and Hormonal Injection today.   ?- Discussed birth spacing of 18 months ? ?4. Sleep and fatigue ?-Encouraged family/partner/community support of 4 hrs of uninterrupted sleep to help with mood and fatigue ? ?5. Physical Recovery  ?- Discussed patients delivery and complications. She describes her labor as good. ?- Patient had a Vaginal, no problems at delivery. Patient had no laceration. Perineal healing reviewed. Patient expressed understanding ?- Patient has urinary incontinence? No. ?- Patient is safe to resume physical and sexual activity ? ?6.  Health Maintenance ?- HM due items addressed Yes ?- Last pap smear  ?Diagnosis  ?Date Value Ref Range Status  ?12/08/2020   Final  ? - Negative for intraepithelial lesion or malignancy (NILM)  ? Pap smear not done at today's visit.  ?-Breast Cancer screening indicated? No.  ? ?7. Chronic Disease/Pregnancy Condition follow up: None ? ?Discussed anxiety/depression. To see Seth Bake today. Start Zoloft. U/R/B reviewed. ? ?Patient desires bilateral tubal sterilization.  Other reversible forms of contraception were discussed with patient; she declines all other modalities. Discussed bilateral tubal sterilization in detail; discussed options of laparoscopic bilateral tubal sterilization using Filshie clips vs laparoscopic bilateral salpingectomy. Risks and benefits discussed in detail including but not limited to: risk of regret,  permanence of method, bleeding, infection, injury to surrounding organs and need for additional procedures.  Failure risk of 1-2 % for Filshie clips and <1% for bilateral salpingectomy with increased risk of ectopic gestation if pregnancy  occurs was also discussed with patient.  Also discussed possible reduction of risk of ovarian cancer via bilateral salpingectomy given that a growing body of knowledge reveals that the majority of cases of high grade serous ?ovarian? cancer actually are actually  cancers arising from the fimbriated end of the fallopian tubes. Emphasized that removal of fallopian tubes do not result in any known hormonal imbalance.  Patient verbalized understanding of these risks and benefits and wants to proceed with sterilization with laparoscopic bilateral sterilization with salpingectomy ? ?She was told that she will be contacted by our surgical scheduler regarding the time and date of her surgery; routine preoperative instructions of having nothing to eat or drink after midnight on the day prior to surgery and also coming to the hospital 1 1/2 hours prior to her time of surgery were also emphasized.  She was told she may be called for a preoperative appointment about a week prior to surgery and will be given further preoperative instructions at that visit.  Routine postoperative instructions will be reviewed with the patient and her family in detail after surgery. Printed patient education handouts about the procedure was given to the patient to review at home. ? ?Medicaid papers have been signed , patient understands that surgery will be scheduled at least 30 days after the day papers are signed as per Medicaid guidelines. ? ?In the meantime, patient will use Depo Provera, given today in office for contraception prior to surgery. ?  ? ?Arlina Robes, MD ?[facility] ? ?

## 2021-08-02 NOTE — Patient Instructions (Addendum)
Health Maintenance, Female ?Adopting a healthy lifestyle and getting preventive care are important in promoting health and wellness. Ask your health care provider about: ?The right schedule for you to have regular tests and exams. ?Things you can do on your own to prevent diseases and keep yourself healthy. ?What should I know about diet, weight, and exercise? ?Eat a healthy diet ? ?Eat a diet that includes plenty of vegetables, fruits, low-fat dairy products, and lean protein. ?Do not eat a lot of foods that are high in solid fats, added sugars, or sodium. ?Maintain a healthy weight ?Body mass index (BMI) is used to identify weight problems. It estimates body fat based on height and weight. Your health care provider can help determine your BMI and help you achieve or maintain a healthy weight. ?Get regular exercise ?Get regular exercise. This is one of the most important things you can do for your health. Most adults should: ?Exercise for at least 150 minutes each week. The exercise should increase your heart rate and make you sweat (moderate-intensity exercise). ?Do strengthening exercises at least twice a week. This is in addition to the moderate-intensity exercise. ?Spend less time sitting. Even light physical activity can be beneficial. ?Watch cholesterol and blood lipids ?Have your blood tested for lipids and cholesterol at 31 years of age, then have this test every 5 years. ?Have your cholesterol levels checked more often if: ?Your lipid or cholesterol levels are high. ?You are older than 31 years of age. ?You are at high risk for heart disease. ?What should I know about cancer screening? ?Depending on your health history and family history, you may need to have cancer screening at various ages. This may include screening for: ?Breast cancer. ?Cervical cancer. ?Colorectal cancer. ?Skin cancer. ?Lung cancer. ?What should I know about heart disease, diabetes, and high blood pressure? ?Blood pressure and heart  disease ?High blood pressure causes heart disease and increases the risk of stroke. This is more likely to develop in people who have high blood pressure readings or are overweight. ?Have your blood pressure checked: ?Every 3-5 years if you are 19-36 years of age. ?Every year if you are 48 years old or older. ?Diabetes ?Have regular diabetes screenings. This checks your fasting blood sugar level. Have the screening done: ?Once every three years after age 18 if you are at a normal weight and have a low risk for diabetes. ?More often and at a younger age if you are overweight or have a high risk for diabetes. ?What should I know about preventing infection? ?Hepatitis B ?If you have a higher risk for hepatitis B, you should be screened for this virus. Talk with your health care provider to find out if you are at risk for hepatitis B infection. ?Hepatitis C ?Testing is recommended for: ?Everyone born from [birth year] through 1965. ?Anyone with known risk factors for hepatitis C. ?Sexually transmitted infections (STIs) ?Get screened for STIs, including gonorrhea and chlamydia, if: ?You are sexually active and are younger than 31 years of age. ?You are older than 31 years of age and your health care provider tells you that you are at risk for this type of infection. ?Your sexual activity has changed since you were last screened, and you are at increased risk for chlamydia or gonorrhea. Ask your health care provider if you are at risk. ?Ask your health care provider about whether you are at high risk for HIV. Your health care provider may recommend a prescription medicine to help prevent HIV  infection. If you choose to take medicine to prevent HIV, you should first get tested for HIV. You should then be tested every 3 months for as long as you are taking the medicine. ?Pregnancy ?If you are about to stop having your period (premenopausal) and you may become pregnant, seek counseling before you get pregnant. ?Take 400 to 800  micrograms (mcg) of folic acid every day if you become pregnant. ?Ask for birth control (contraception) if you want to prevent pregnancy. ?Osteoporosis and menopause ?Osteoporosis is a disease in which the bones lose minerals and strength with aging. This can result in bone fractures. If you are 70 years old or older, or if you are at risk for osteoporosis and fractures, ask your health care provider if you should: ?Be screened for bone loss. ?Take a calcium or vitamin D supplement to lower your risk of fractures. ?Be given hormone replacement therapy (HRT) to treat symptoms of menopause. ?Follow these instructions at home: ?Alcohol use ?Do not drink alcohol if: ?Your health care provider tells you not to drink. ?You are pregnant, may be pregnant, or are planning to become pregnant. ?If you drink alcohol: ?Limit how much you have to: ?0-1 drink a day. ?Know how much alcohol is in your drink. In the [place] one drink equals one 12 oz bottle of beer (355 mL), one 5 oz glass of wine (148 mL), or one 1? oz glass of hard liquor (44 mL). ?Lifestyle ?Do not use any products that contain nicotine or tobacco. These products include cigarettes, chewing tobacco, and vaping devices, such as e-cigarettes. If you need help quitting, ask your health care provider. ?Do not use street drugs. ?Do not share needles. ?Ask your health care provider for help if you need support or information about quitting drugs. ?General instructions ?Schedule regular health, dental, and eye exams. ?Stay current with your vaccines. ?Tell your health care provider if: ?You often feel depSalpingectomy, Care After ?The following information offers guidance on how to care for yourself after your procedure. Your health care provider may also give you more specific instructions. If you have problems or questions, contact your health care provider. ?What can I expect after the procedure? ?After the procedure, it is common to have: ?Pain in your abdomen. ?Light  vaginal bleeding (spotting) for a few days. ?Tiredness. ?Your recovery time will depend on which method was used for your surgery. ?Follow these instructions at home: ?Medicines ?Take over-the-counter and prescription medicines only as told by your health care provider. ?Ask your health care provider if the medicine prescribed to you: ?Requires you to avoid driving or using machinery. ?Can cause constipation. You may need to take actions to prevent or treat constipation, such as: ?Drink enough fluid to keep your urine pale yellow. ?Take over-the-counter or prescription medicines. ?Eat foods that are high in fiber, such as beans, whole grains, and fresh fruits and vegetables. ?Limit foods that are high in fat and processed sugars, such as fried or sweet foods. ?Incision care ? ?Follow instructions from your health care provider about how to take care of your incision or incisions. Make sure you: ?Wash your hands with soap and water for at least 20 seconds before and after you change your bandage (dressing). If soap and water are not available, use hand sanitizer. ?Change or remove your dressing as told by your health care provider. ?Leave stitches (sutures), skin glue, staples, or adhesive strips in place. These skin closures may need to stay in place for 2 weeks or  longer. If adhesive strip edges start to loosen and curl up, you may trim the loose edges. Do not remove adhesive strips completely unless your health care provider tells you to do that. ?Keep your dressing clean and dry. ?Check your incision area every day for signs of infection. Check for: ?Redness, swelling, or pain that gets worse. ?Fluid or blood. ?Warmth. ?Pus or a bad smell. ?Activity ?Rest as told by your health care provider. ?Avoid sitting for a long time without moving. Get up to take short walks every 1-2 hours. This is important to improve blood flow and breathing. Ask for help if you feel weak or unsteady. ?Return to your normal activities  as told by your health care provider. Ask your health care provider what activities are safe for you. ?Do not drive until your health care provider says that it is safe. ?Do not lift anything that is heavier

## 2021-08-03 NOTE — BH Specialist Note (Signed)
Integrated Behavioral Health Initial In-Person Visit ? ?MRN: [id number] ?Name: Kaitlin Bryant ? ?Number of Integrated Behavioral Health Clinician visits: 1 ?Session Start time:   345pm ?Session End time: 420pm ?Total time in minutes: 35 mins in person at [facility]  ? ?Types of Service: Individual psychotherapy ? ?Interpretor:No. Interpretor Name and Language: none ? ? Warm Hand Off Completed. ?  ? ?  ? ? ?Subjective: ?Kaitlin Bryant is a 31 y.o. female accompanied by n/a ?Patient was referred by Dr. Alysia Bryant for anxiety. ?Patient reports the following symptoms/concerns: worry, social isolated, trouble sleeping, lack of social support ?Duration of problem: one month; Severity of problem: mild ? ?Objective: ?Mood: Anxious and Affect: Appropriate ?Risk of harm to self or others: No plan to harm self or others ? ?Life Context: ?Family and Social: Lives with partner and children  ?School/Work: [employer]  ?Self-Care: n/a ?Life Changes: n/a ? ?Patient and/or Family's Strengths/Protective Factors: ?Concrete supports in place (healthy food, safe environments, etc.) ? ?Goals Addressed: ?Patient will: ?Reduce symptoms of: anxiety ?Increase knowledge and/or ability of: coping skills  ?Demonstrate ability to: Increase adequate support systems for patient/family ? ?Progress towards Goals: ?Ongoing ? ?Interventions: ?Interventions utilized: Supportive Counseling  ?Standardized Assessments completed: Edinburgh Postnatal Depression ? ?Patient and/or Family Response: Kaitlin Bryant responded well to visit.  ? ?Assessment: ?Patient currently experiencing postpartum anxiety ?Patient may benefit from integrated behavioral health. ? ?Plan: ?Follow up with behavioral health clinician on : prn ?Behavioral recommendations: Delegate task with partner for added support, developing a routine and prioritize rest and self care will prevent burnout.  ?Referral(s): Integrated [employer] (In Clinic) ?"From scale of 1-10, how likely are you to  follow plan?":   ? ?Kaitlin Saxon, LCSW ? ? ? ? ? ? ? ? ?

## 2021-08-23 NOTE — Progress Notes (Deleted)
NEUROLOGY FOLLOW UP OFFICE NOTE  Ramonica Grigg [id number]  Assessment/Plan:   Pregnancy History of idiopathic intracranial hypertension Reports headaches/migraines, which may just be headaches aggravated by pregnancy   1  At this time, she needs a formal eye exam by ophthalmology to evaluate for active papilledema and assess visual fields.  She has been referred.  She will have the note faxed to me. 2  For frequent headaches, recommended magnesium oxide 425m daily, riboflavin 4032mdaily and CoQ10 10072mhree times daily 3  Tylenol as needed but limit use to no more than 2 days out of week to prevent risk of rebound or medication-overuse headache. 4  Follow up 6 months.     Subjective:  CorPreethi Scantlebury a 31 13ar old female who follows up for idiopathic intracranial hypertension.   UPDATE: Patient delivered her baby on 06/20/2021 via NSVD without complication.  ***   HISTORY: For years, she reports pulsatile tinnitus, like a whooshing sound in her ears.  She has occasional headaches which are manageable.  She denies blurred vision or visual obscurations.  She typically has an eye exam every one or two years.  Her last exam was at most 2 years ago.  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  She does report having gained at least 20 lbs over the past year.  She is not on birth control.    MRI of brain/orbits with and without contrast and MRV of head from 11/14/2018 showed small nonspecific 4 mm T2/FLAIR signal focus in the dorsal left thalamus with no associated enhancement.  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  MRV showed effaced appearance of the bilateral transverse/sigmoid sinus junctions but no dural sinus thrombosis.    She underwent LP on 11/19/2018, which demonstrated an elevated opening pressure of 33 cm H2O.  CSF cell count, protein, glucose and  Gram stain & culture negative.     She saw Dr. WeaKathlen Mody ophthalmology on 02/21/2019. She did not demonstrate disc edema on exam.  Follow up with Dr. WeaKathlen Mody 06/24/2019 and still did not demonstrate any papilledema.   PAST MEDICAL HISTORY: Past Medical History:  Diagnosis Date   Idiopathic intracranial hypertension     MEDICATIONS: Current Outpatient Medications on File Prior to Visit  Medication Sig Dispense Refill   acetaminophen (TYLENOL) 325 MG tablet Take 2 tablets (650 mg total) by mouth every 4 (four) hours as needed (for pain scale < 4).  0   Blood Pressure Monitoring (BLOOD PRESSURE KIT) DEVI 1 kit by Does not apply route once a week. 1 each 0   coconut oil OIL Apply 1 application topically as needed.  0   ibuprofen (ADVIL) 600 MG tablet Take 1 tablet (600 mg total) by mouth every 6 (six) hours. 30 tablet 0   Prenat-Fe Poly-Methfol-FA-DHA (VITAFOL ULTRA) 29-0.6-0.4-200 MG CAPS Take 1 capsule by mouth daily. 30 capsule 11   sertraline (ZOLOFT) 50 MG tablet Take 1 tablet (50 mg total) by mouth daily. 30 tablet 5   No current facility-administered medications on file prior to visit.    ALLERGIES: No Known Allergies  FAMILY HISTORY: Family History  Problem Relation Age of Onset   Healthy Mother    Healthy Father    Healthy Sister    Healthy Brother    Diabetes Paternal Grandmother    Cancer - Colon Paternal Grandfather    Healthy  Daughter       Objective:  *** General: No acute distress.  Patient appears ***-groomed.   Head:  Normocephalic/atraumatic Eyes:  Fundi examined but not visualized Neck: supple, no paraspinal tenderness, full range of motion Heart:  Regular rate and rhythm Lungs:  Clear to auscultation bilaterally Back: No paraspinal tenderness Neurological Exam: alert and oriented to person, place, and time.  Speech fluent and not dysarthric, language intact.  CN II-XII intact. Bulk and tone normal, muscle strength 5/5 throughout.  Sensation to light  touch intact.  Deep tendon reflexes 2+ throughout, toes downgoing.  Finger to nose testing intact.  Gait normal, Romberg negative.   Metta Clines, DO  CC: ***

## 2021-08-24 ENCOUNTER — Encounter: Payer: Self-pay | Admitting: Neurology

## 2021-08-24 ENCOUNTER — Ambulatory Visit: Payer: Medicaid Other | Admitting: Neurology

## 2021-08-24 DIAGNOSIS — Z029 Encounter for administrative examinations, unspecified: Secondary | ICD-10-CM
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
## 2021-08-31 NOTE — Progress Notes (Signed)
Surgical Instructions ? ? ? Your procedure is scheduled on Tuesday 09/07/21. ? Report to [facility] Main Entrance "A" at 7:20 A.M., then check in with the Admitting office. ? Call this number if you have problems the morning of surgery: ? [phone] ? ? If you have any questions prior to your surgery date call [phone]: Open Monday-Friday 8am-4pm ? ? ? Remember: ? Do not eat after midnight the night before your surgery ? ?You may drink clear liquids until 6:20am the morning of your surgery.   ?Clear liquids allowed are: Water, Non-Citrus Juices (without pulp), Carbonated Beverages, Clear Tea, Black Coffee ONLY (NO MILK, CREAM OR POWDERED CREAMER of any kind), and Gatorade ?  ? Take these medicines the morning of surgery with A SIP OF WATER:  ?acetaminophen (TYLENOL) if needed ?sertraline (ZOLOFT)  ? ?As of today, STOP taking any Aspirin (unless otherwise instructed by your surgeon) Aleve, Naproxen, Ibuprofen, Motrin, Advil, Goody's, BC's, all herbal medications, fish oil, and all vitamins. ? ?         ?Do not wear jewelry or makeup ?Do not wear lotions, powders, perfumes/colognes, or deodorant. ?Do not shave 48 hours prior to surgery.   ?Do not bring valuables to the hospital. ?Do not wear nail polish, gel polish, artificial nails, or any other type of covering on natural nails (fingers and toes) ?If you have artificial nails or gel coating that need to be removed by a nail salon, please have this removed prior to surgery. Artificial nails or gel coating may interfere with anesthesia's ability to adequately monitor your vital signs. ? ?[facility] is not responsible for any belongings or valuables. .  ? ?Do NOT Smoke (Tobacco/Vaping)  24 hours prior to your procedure ? ?If you use a CPAP at night, you may bring your mask for your overnight stay. ?  ?Contacts, glasses, hearing aids, dentures or partials may not be worn into surgery, please bring cases for these belongings ?  ?For patients admitted to the  hospital, discharge time will be determined by your treatment team. ?  ?Patients discharged the day of surgery will not be allowed to drive home, and someone needs to stay with them for 24 hours. ? ? ?SURGICAL WAITING ROOM VISITATION ?Patients having surgery or a procedure in a hospital may have two support people. ?Children under the age of 59 must have an adult with them who is not the patient. ?They may stay in the waiting area during the procedure and may switch out with other visitors. If the patient needs to stay at the hospital during part of their recovery, the visitor guidelines for inpatient rooms apply. ? ?Please refer to the [facility] website for the visitor guidelines for Inpatients (after your surgery is over and you are in a regular room).  ? ? ? ? ? ?Special instructions:   ? ?Oral Hygiene is also important to reduce your risk of infection.  Remember - BRUSH YOUR TEETH THE MORNING OF SURGERY WITH YOUR REGULAR TOOTHPASTE ? ? ?[facility]- Preparing For Surgery ? ?Before surgery, you can play an important role. Because skin is not sterile, your skin needs to be as free of germs as possible. You can reduce the number of germs on your skin by washing with CHG (chlorahexidine gluconate) Soap before surgery.  CHG is an antiseptic cleaner which kills germs and bonds with the skin to continue killing germs even after washing.   ? ? ?Please do not use if you have an allergy to  CHG or antibacterial soaps. If your skin becomes reddened/irritated stop using the CHG.  ?Do not shave (including legs and underarms) for at least 48 hours prior to first CHG shower. It is OK to shave your face. ? ?Please follow these instructions carefully. ?  ? ? Shower the NIGHT BEFORE SURGERY and the MORNING OF SURGERY with CHG Soap.  ? If you chose to wash your hair, wash your hair first as usual with your normal shampoo. After you shampoo, rinse your hair and body thoroughly to remove the shampoo.  Then [employer] and genitals  (private parts) with your normal soap and rinse thoroughly to remove soap. ? ?After that Use CHG Soap as you would any other liquid soap. You can apply CHG directly to the skin and wash gently with a scrungie or a clean washcloth.  ? ?Apply the CHG Soap to your body ONLY FROM THE NECK DOWN.  Do not use on open wounds or open sores. Avoid contact with your eyes, ears, mouth and genitals (private parts). Wash Face and genitals (private parts)  with your normal soap.  ? ?Wash thoroughly, paying special attention to the area where your surgery will be performed. ? ?Thoroughly rinse your body with warm water from the neck down. ? ?DO NOT shower/wash with your normal soap after using and rinsing off the CHG Soap. ? ?Pat yourself dry with a CLEAN TOWEL. ? ?Wear CLEAN PAJAMAS to bed the night before surgery ? ?Place CLEAN SHEETS on your bed the night before your surgery ? ?DO NOT SLEEP WITH PETS. ? ? ?Day of Surgery: ?Take a shower with CHG soap. ?Wear Clean/Comfortable clothing the morning of surgery ?Do not apply any deodorants/lotions.   ?Remember to brush your teeth WITH YOUR REGULAR TOOTHPASTE. ? ? ? ?If you received a COVID test during your pre-op visit, it is requested that you wear a mask when out in public, stay away from anyone that may not be feeling well, and notify your surgeon if you develop symptoms. If you have been in contact with anyone that has tested positive in the last 10 days, please notify your surgeon. ? ?  ?Please read over the following fact sheets that you were given.  ? ?

## 2021-09-01 ENCOUNTER — Encounter (HOSPITAL_COMMUNITY)
Admission: RE | Admit: 2021-09-01 | Discharge: 2021-09-01 | Disposition: A | Discharge status: Home / Self Care | Payer: Medicaid Other | Source: Ambulatory Visit | Attending: Obstetrics and Gynecology | Admitting: Obstetrics and Gynecology

## 2021-09-01 ENCOUNTER — Other Ambulatory Visit: Payer: Self-pay

## 2021-09-01 ENCOUNTER — Encounter (HOSPITAL_COMMUNITY): Payer: Self-pay

## 2021-09-01 DIAGNOSIS — Z3009 Encounter for other general counseling and advice on contraception: Secondary | ICD-10-CM

## 2021-09-01 DIAGNOSIS — Z6841 Body Mass Index (BMI) 40.0 and over, adult: Secondary | ICD-10-CM | POA: Diagnosis not present

## 2021-09-01 DIAGNOSIS — G932 Benign intracranial hypertension: Secondary | ICD-10-CM | POA: Insufficient documentation

## 2021-09-01 DIAGNOSIS — F419 Anxiety disorder, unspecified: Secondary | ICD-10-CM | POA: Diagnosis not present

## 2021-09-01 DIAGNOSIS — E669 Obesity, unspecified: Secondary | ICD-10-CM | POA: Insufficient documentation

## 2021-09-01 DIAGNOSIS — Z01812 Encounter for preprocedural laboratory examination: Secondary | ICD-10-CM | POA: Insufficient documentation

## 2021-09-01 DIAGNOSIS — D573 Sickle-cell trait: Secondary | ICD-10-CM | POA: Diagnosis not present

## 2021-09-01 DIAGNOSIS — Z87891 Personal history of nicotine dependence: Secondary | ICD-10-CM | POA: Insufficient documentation

## 2021-09-01 DIAGNOSIS — Z9049 Acquired absence of other specified parts of digestive tract: Secondary | ICD-10-CM | POA: Insufficient documentation

## 2021-09-01 HISTORY — DX: Sickle-cell trait: D57.3

## 2021-09-01 HISTORY — DX: Other complications of anesthesia, initial encounter: T88.59XA

## 2021-09-01 HISTORY — DX: Anxiety disorder, unspecified: F41.9

## 2021-09-01 LAB — BASIC METABOLIC PANEL
Anion gap: 10 (ref 5–15)
BUN: 9 mg/dL (ref 6–20)
CO2: 26 mmol/L (ref 22–32)
Calcium: 9.1 mg/dL (ref 8.9–10.3)
Chloride: 106 mmol/L (ref 98–111)
Creatinine, Ser: 1.04 mg/dL — ABNORMAL HIGH (ref 0.44–1.00)
GFR, Estimated: >60 mL/min (ref >60)
Glucose, Bld: 129 mg/dL — ABNORMAL HIGH (ref 70–99)
Potassium: 3.5 mmol/L (ref 3.5–5.1)
Sodium: 142 mmol/L (ref 135–145)

## 2021-09-01 LAB — CBC
HCT: 33.5 % — ABNORMAL LOW (ref 36.0–46.0)
Hemoglobin: 10.8 g/dL — ABNORMAL LOW (ref 12.0–15.0)
MCH: 25 pg — ABNORMAL LOW (ref 26.0–34.0)
MCHC: 32.2 g/dL (ref 30.0–36.0)
MCV: 77.5 fL — ABNORMAL LOW (ref 80.0–100.0)
Platelets: 358 10*3/uL (ref 150–400)
RBC: 4.32 MIL/uL (ref 3.87–5.11)
RDW: 17.4 % — ABNORMAL HIGH (ref 11.5–15.5)
WBC: 11.4 10*3/uL — ABNORMAL HIGH (ref 4.0–10.5)
nRBC: 0 % (ref 0.0–0.2)

## 2021-09-01 NOTE — Progress Notes (Addendum)
PCP - [facility] ? ?Cardiologist - Denies ? ?EP- Denies ? ?Endocrine- Denies ? ?Pulm- Denies ? ?Chest x-ray - Denies ? ?EKG - Denies ? ?Stress Test - Denies ? ?ECHO - Denies ? ?Cardiac Cath - Denies ? ?AICD-na ?PM-na ?LOOP-na ? ?Nerve Stimulator- Denies ? ?Dialysis- Denies ? ?Sleep Study - Denies ?CPAP - Denies ? ?LABS- 09/01/21: CBC, BMP ?09/07/21: POC UPreg ? ?ASA- Denies ? ?ERAS- Yes- clears until 0620 ? ?HA1C- Denies ? ?Anesthesia- Yes- sickle cell trait ? ?Pt denies having chest pain, sob, or fever at this time. All instructions explained to the pt, with a verbal understanding of the material. Pt agrees to go over the instructions while at home for a better understanding. Pt also instructed to wear a mask and social distance if she goes out. The opportunity to ask questions was provided.  ?

## 2021-09-02 ENCOUNTER — Encounter (HOSPITAL_COMMUNITY): Payer: Self-pay | Admitting: Certified Registered Nurse Anesthetist

## 2021-09-02 ENCOUNTER — Encounter (HOSPITAL_COMMUNITY): Payer: Self-pay | Admitting: Vascular Surgery
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
## 2021-09-02 NOTE — Anesthesia Preprocedure Evaluation (Deleted)
Anesthesia Evaluation  ? ? ?Airway ? ? ? ? ? ? ? Dental ?  ?Pulmonary ?former smoker,  ?  ? ? ? ? ? ? ? Cardiovascular ? ? ? ?  ?Neuro/Psych ?  ? GI/Hepatic ?  ?Endo/Other  ? ? Renal/GU ?  ? ?  ?Musculoskeletal ? ? Abdominal ?  ?Peds ? Hematology ?  ?Anesthesia Other Findings ? ? Reproductive/Obstetrics ? ?  ? ? ? ? ? ? ? ? ? ? ? ? ? ?  ?  ? ? ? ? ? ? ? ? ?Anesthesia Physical ?Anesthesia Plan ? ?ASA:  ? ?Anesthesia Plan:   ? ?Post-op Pain Management:   ? ?Induction:  ? ?PONV Risk Score and Plan:  ? ?Airway Management Planned:  ? ?Additional Equipment:  ? ?Intra-op Plan:  ? ?Post-operative Plan:  ? ?Informed Consent:  ? ?Plan Discussed with:  ? ?Anesthesia Plan Comments: (PAT note written 09/02/2021 by Shonna Chock, PA-C. ?)  ? ? ? ? ? ? ?Anesthesia Quick Evaluation ? ?

## 2021-09-07 ENCOUNTER — Ambulatory Visit (HOSPITAL_COMMUNITY)
Admission: RE | Admit: 2021-09-07 | Payer: Medicaid Other | Source: Home / Self Care | Admitting: Obstetrics and Gynecology

## 2021-09-07 ENCOUNTER — Encounter (HOSPITAL_COMMUNITY): Admission: RE | Payer: Self-pay | Source: Home / Self Care

## 2021-09-07 DIAGNOSIS — Z3009 Encounter for other general counseling and advice on contraception: Secondary | ICD-10-CM

## 2021-09-07 SURGERY — SALPINGECTOMY, BILATERAL, LAPAROSCOPIC
Anesthesia: Choice | Laterality: Bilateral

## 2023-01-29 IMAGING — US US OB LIMITED
1 series · 7 of 7 positions shown · non-contrast
Comparison: none

[Series 1: us ob limited · 7 of 7 slices shown]
[im 1/7]
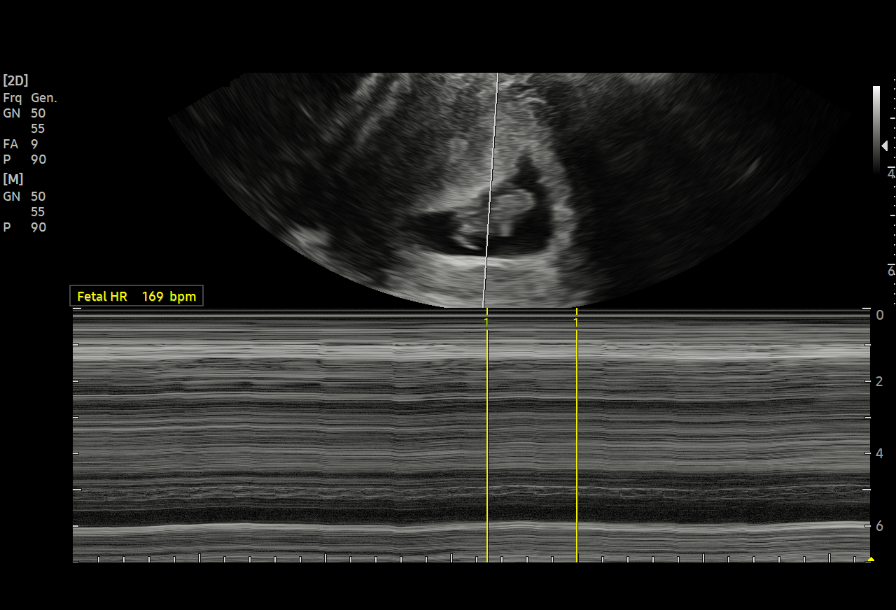
[im 2/7]
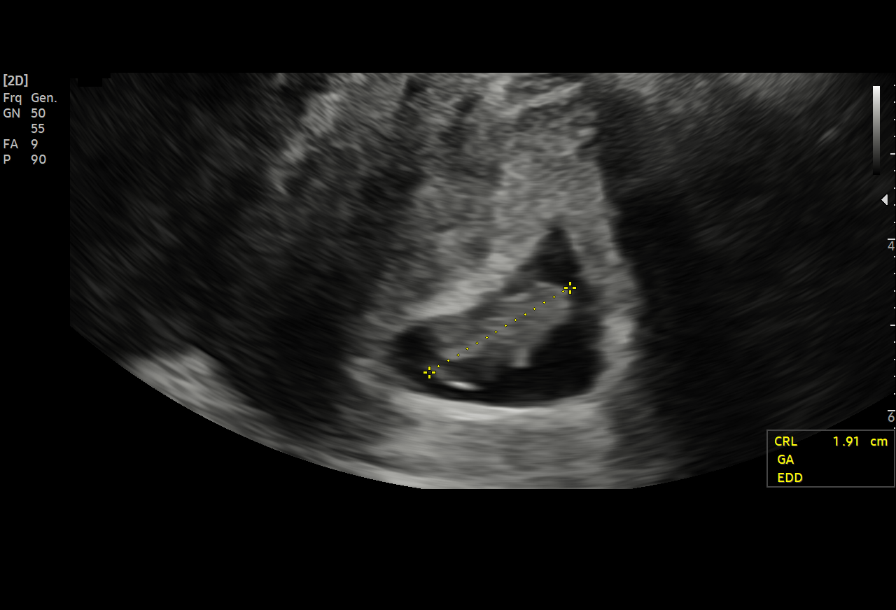
[im 3/7]
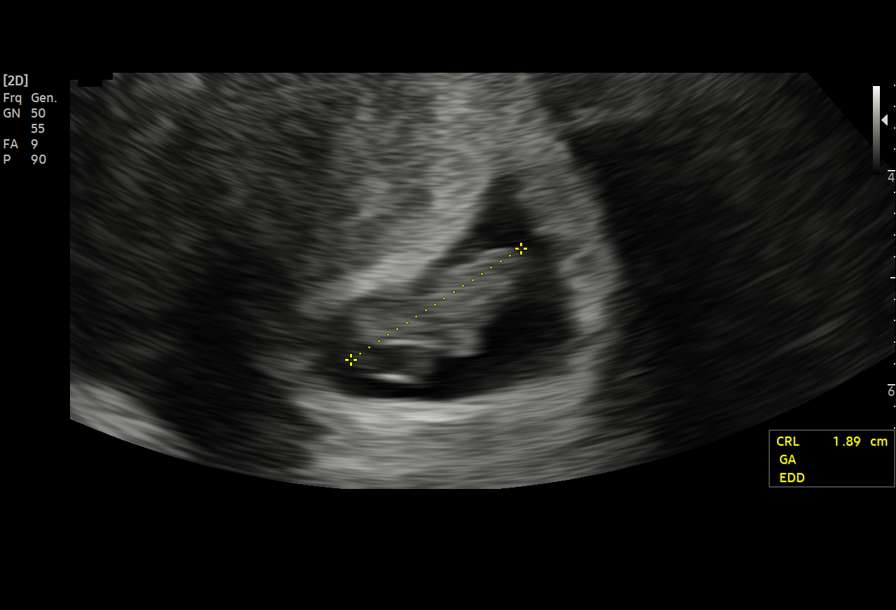
[im 4/7]
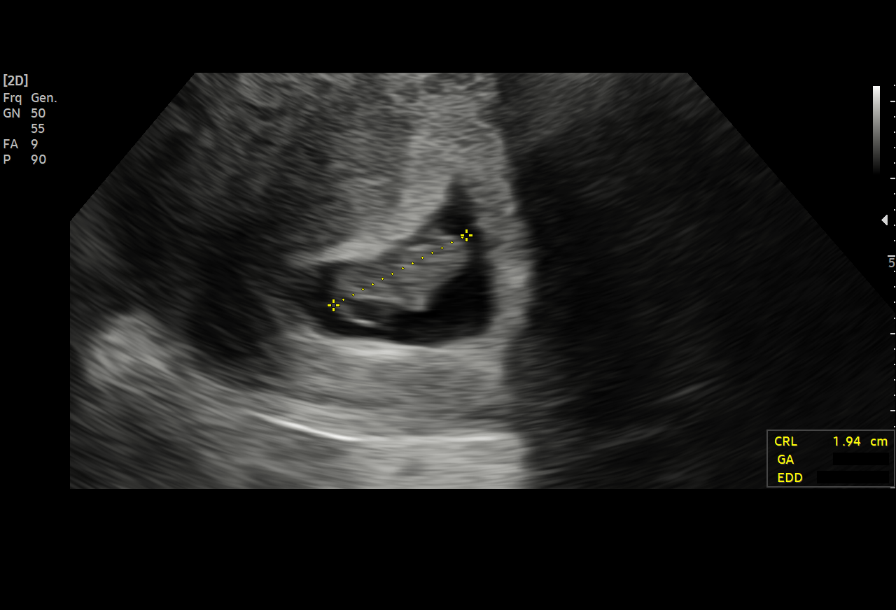
[im 5/7]
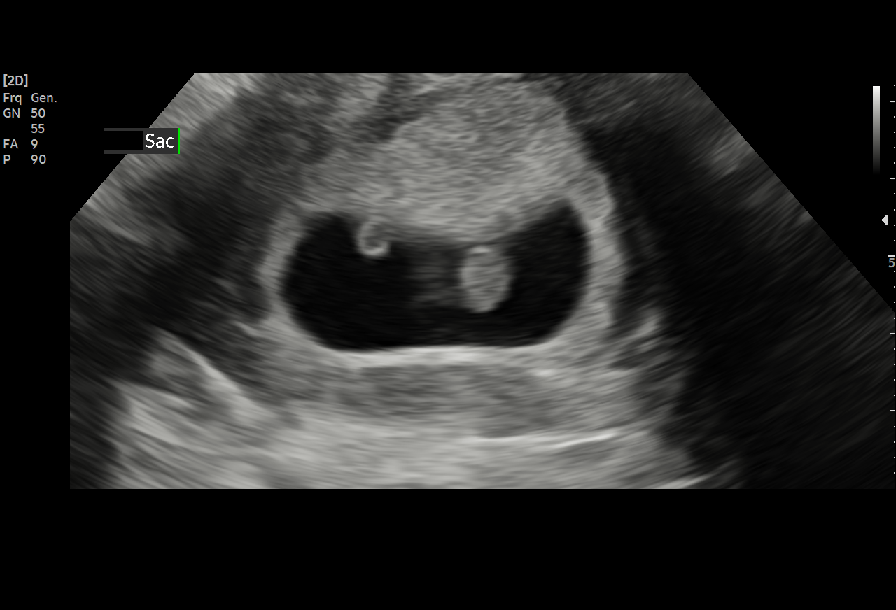
[im 6/7]
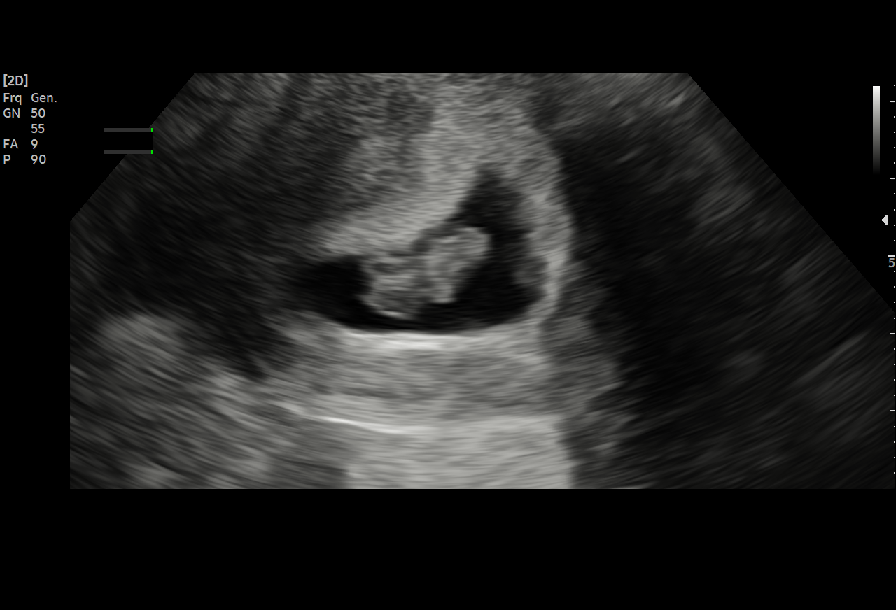
[im 7/7]
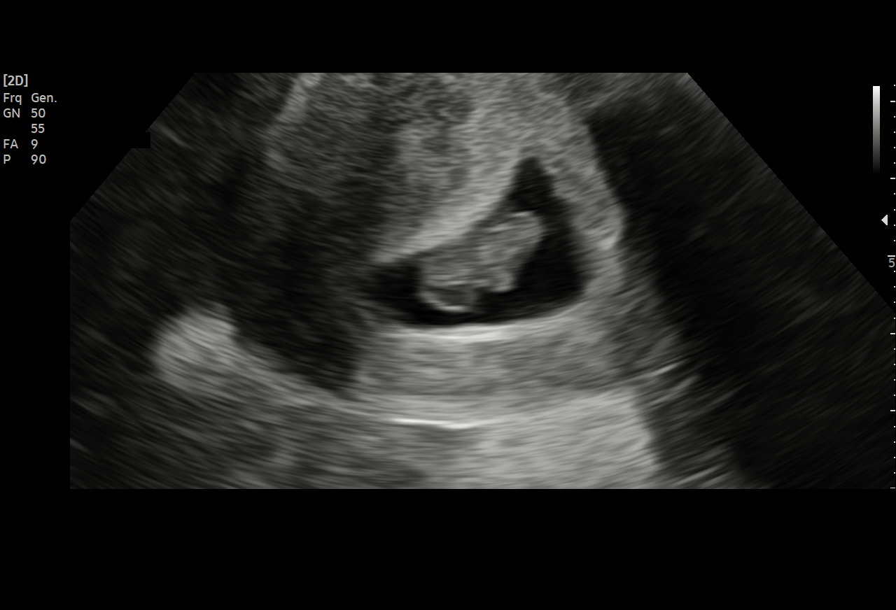

[7 of 7 positions shown; findings below may reference images not displayed]

[REDACTED]care

 1  [HOSPITAL]                         [address].0     [facility]

Indications

 8 weeks gestation of pregnancy
Fetal Evaluation

 Num Of Fetuses:         1
 Fetal Heart Rate(bpm):  169
 Cardiac Activity:       Observed
Biometry

 CRL:      19.1  mm     G. Age:  8w 2d                   EDD:   07/10/21
Gestational Age

 Best:          8w 2d      Det. By:  U/S C R L (11/30/20)     EDD:   07/10/21
Comments

 Single live IUP at 8w2d by CRL. FHR 169. Technically limited
 exam due to early Gestational Age.
Impression

 Viable intrauterine pregnancy
Recommendations

 Routine prenatal care
                [place], Castol

## 2023-04-25 IMAGING — US US MFM OB DETAIL+14 WK
1 series · 15 of 28 positions shown · non-contrast
Comparison: none

[Series 1: us mfm ob detail+14 wk · 15 of 148 slices shown]
[im 1/148]
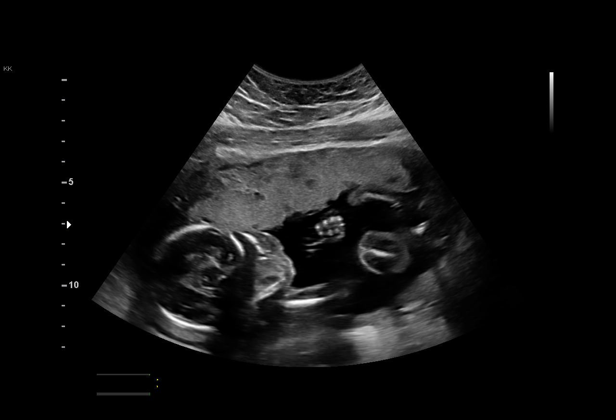
[im 11/148]
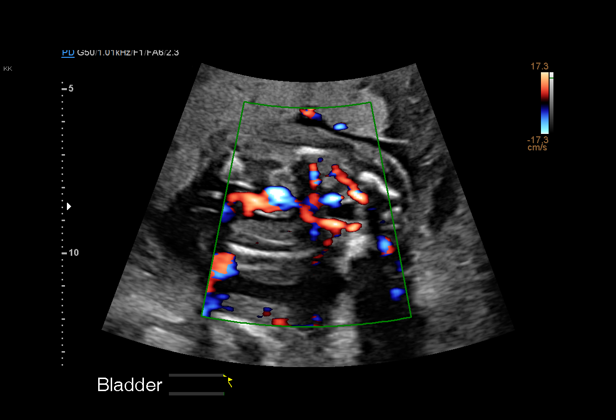
[im 22/148]
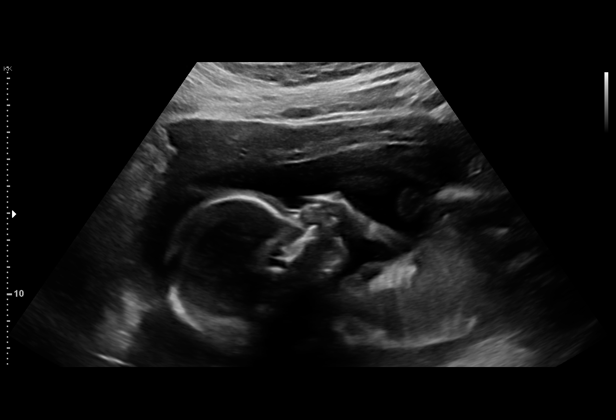
[im 33/148]
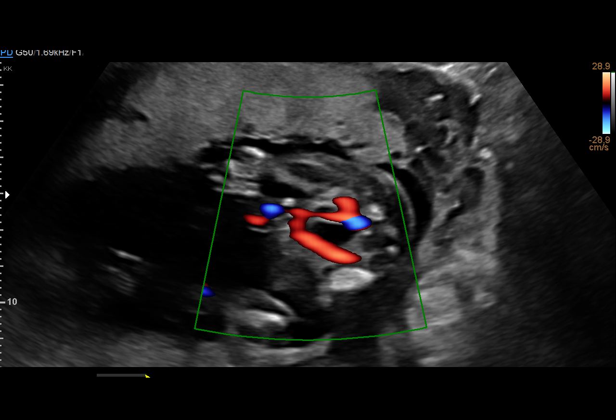
[im 44/148]
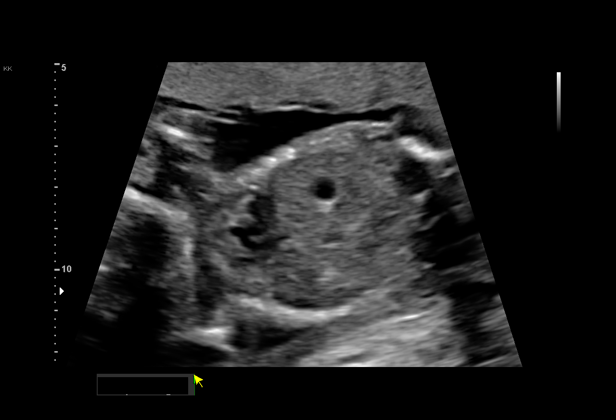
[im 55/148]
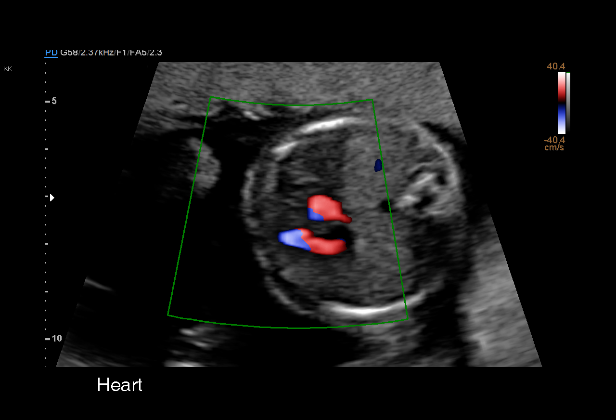
[im 66/148]
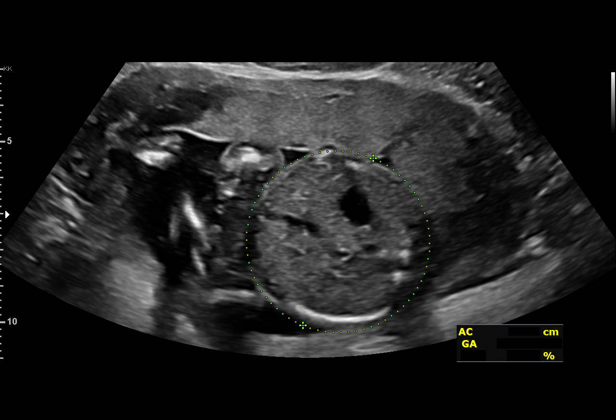
[im 77/148]
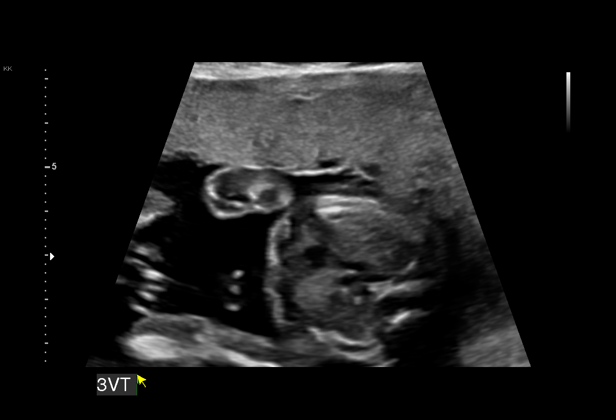
[im 82/148]
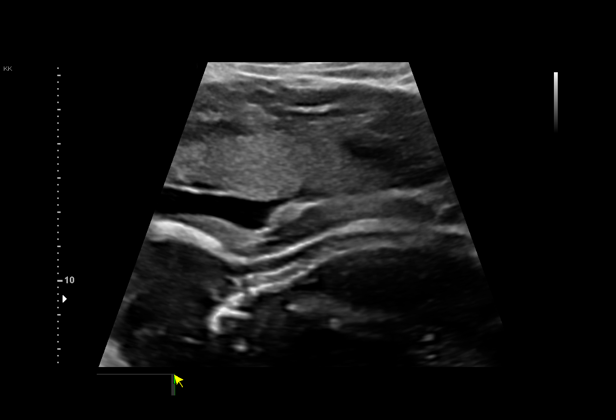
[im 93/148]
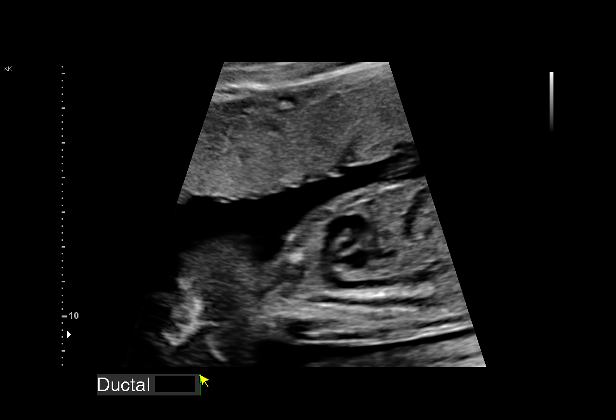
[im 104/148]
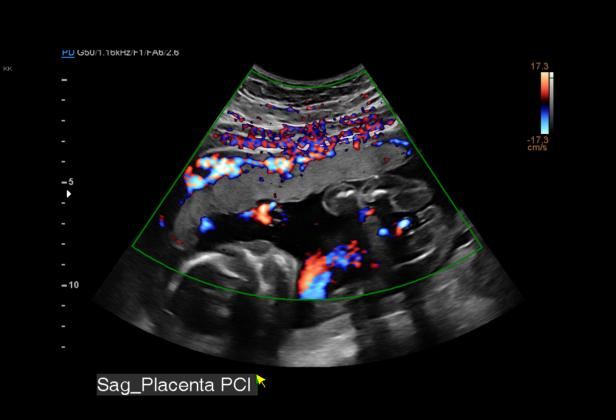
[im 115/148]
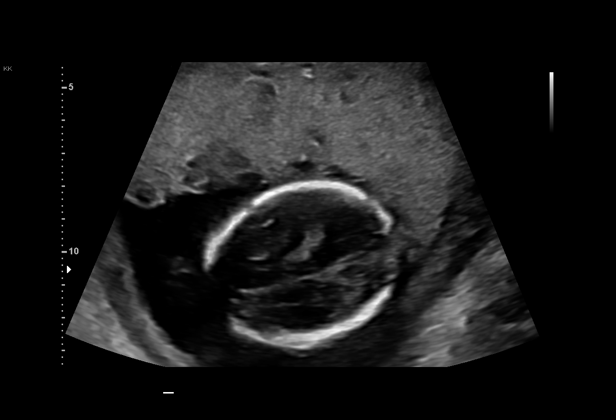
[im 126/148]
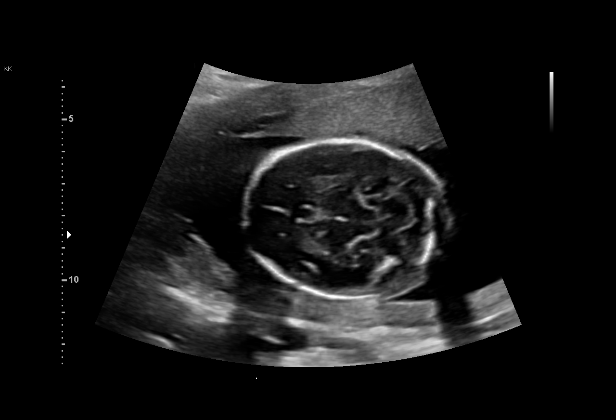
[im 137/148]
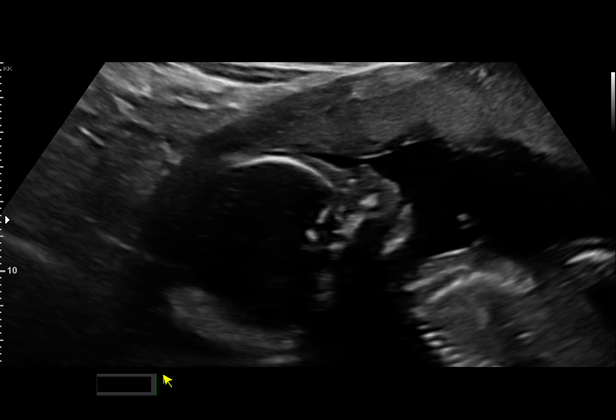
[im 148/148]
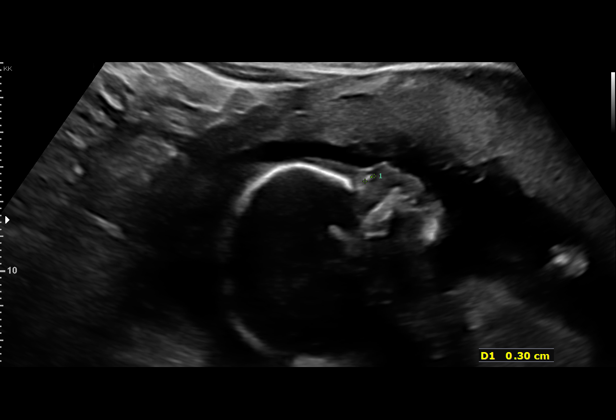

[15 of 28 positions shown; findings below may reference images not displayed]

Ref. Address:     Faculty

Indications

 Obesity complicating pregnancy, second
 trimester (BMI 42)
 Abnormal fetal ultrasound (absent nasal
 bone)
 Medical complication of pregnancy (History
 of idiopathic intracranial hypertension)
 Encounter for antenatal screening for
 malformations
 20 weeks gestation of pregnancy
 Low Risk NIPS(Negative AFP)
 History of sickle cell trait
Fetal Evaluation

 Num Of Fetuses:         1
 Fetal Heart Rate(bpm):  158
 Cardiac Activity:       Observed
 Presentation:           Breech
 Placenta:               Anterior
 P. Cord Insertion:      Visualized

 Amniotic Fluid
 AFI FV:      Within normal limits

                             Largest Pocket(cm)

Biometry

 BPD:      49.1  mm     G. Age:  20w 6d         61  %    CI:        74.11   %    70 - 86
                                                         FL/HC:      18.5   %    15.9 -
 HC:      181.1  mm     G. Age:  20w 4d         39  %    HC/AC:      1.15        1.06 -
 AC:      157.4  mm     G. Age:  20w 6d         54  %    FL/BPD:     68.2   %
 FL:       33.5  mm     G. Age:  20w 3d         39  %    FL/AC:      21.3   %    20 - 24
 CER:        21  mm     G. Age:  20w 0d         47  %

 LV:          6  mm
 CM:        4.3  mm

 Est. FW:     371  gm    0 lb 13 oz      52  %
OB History

 Gravidity:    2         Term:   1        Prem:   0        SAB:   0
 TOP:          0       Ectopic:  0        Living: 1
Gestational Age

 LMP:           21w 3d        Date:  09/27/20                 EDD:   07/04/21
 U/S Today:     20w 5d                                        EDD:   07/09/21
 Best:          20w 4d     Det. By:  U/S C R L  (11/30/20)    EDD:   07/10/21
Anatomy

 Cranium:               Appears normal         Aortic Arch:            Appears normal
 Cavum:                 Appears normal         Ductal Arch:            Appears normal
 Ventricles:            Appears normal         Diaphragm:              Appears normal
 Choroid Plexus:        Appears normal         Stomach:                Appears normal, left
                                                                       sided
 Cerebellum:            Appears normal         Abdomen:                Appears normal
 Posterior Fossa:       Appears normal         Abdominal Wall:         Appears nml (cord
                                                                       insert, abd wall)
 Nuchal Fold:           Not applicable (>20    Cord Vessels:           Appears normal (3
                        wks GA)                                        vessel cord)
 Face:                  Absent nasal           Kidneys:                Appear normal
                        bone,orbits nml
 Lips:                  Appears normal         Bladder:                Appears normal
 Thoracic:              Appears normal         Spine:                  Not well visualized
 Heart:                 Appears normal         Upper Extremities:      Appears normal
                        (4CH, axis, and
                        situs)
 RVOT:                  Appears normal         Lower Extremities:      Appears normal
 LVOT:                  Appears normal

 Other:  Hands visualized. Technically difficult due to maternal habitus and
         fetal position. Fetus appears to be a male.
Cervix Uterus Adnexa

 Cervix
 Length:           3.78  cm.
 Normal appearance by transabdominal scan.
 Adnexa
 No abnormality visualized.
Impression

 We performed a fetal anatomical survey.  Amniotic fluid is
 normal and good fetal activity seen.  Fetal biometry is
 consistent with the previously established dates.  Nasal bone
 is hypoplastic.  No other markers of aneuploidies or fetal
 structural defects are seen.
 As maternal obesity imposes limitations on resolution of
 ultrasound images, fetal anomalies can be missed.
 xxxxxxxxxxxxxxxxxxxxxxxxxxxxxxxxxxxxxxxxxxxxxxxxxxxxxxxxx
 x
 Consultation (see [REDACTED] )
 I [facility] the [facility] She is G2 P1 at 20w 4d gestation
 and is here for fetal anatomy scan.
 Past medical history significant for idiopathic intracranial
 hypertension diagnosed about 2 years ago.  She had lumbar
 puncture to relieve ocular pressure.  Patient was taking
 acetazolamide till October 2019 and discontinued on the
 recommendations of her ophthalmologist.  She does not have
 any headaches or pain in the eyes or diplopia now.
 No history of hypertension or diabetes or any chronic medical
 conditions.  She has sickle cell trait and her partner had told
 her that he does not have sickle cell trait.
 Past surgical history: Laparoscopic cholecystectomy (3432).
 Medications: Prenatal vitamins.
 Allergies: No known drug allergies.
 Social history: Denies tobacco or drug or alcohol use.  Her
 partner is an African American and he is in good health.  He
 is not the father of her first child.
 Family history: No history of venous thromboembolism in the
 family.
 Obstetric history significant for a term vaginal delivery of a
 female infant weighing 5 pounds at birth.  Patient reports her
 pregnancy was not complicated by fetal growth restriction.
 GYN history: No history of abnormal Pap smears or cervical
 surgeries.  No history of breast biopsies.  Her previous
 menstrual cycles were regular.
 Our concerns include
 Idiopathic intracranial hypertension (IHI)
 Also known as pseudotumor cerebri is characterized
 increased CSF pressure (with normal composition) and
 absence of any intracranial lesions on neuroimaging. It most-
 commonly presents as headache. Other symptoms include
 retrobulbar pain, photopsia or diplopia. Visual loss can also
 occur, which although transient, can be permanent in about
 20-25% of cases in the long run.
 I reassured our patient that pregnancy does not worsen this
 condition in the short or long term. No increased visual loss
 rates have been reported in women who had pregnancies. IHI
 also does not adversely affect the pregnancy. No increase in
 miscarriages or preterm deliveries is reported.
 Treatment: Acetazolamide is the most-commonly used drug
 in this condition. It reduces the CSF production up to 50% in
 some cases, thereby, providing relief in symptoms. It is a
 category C drug. The Collaborative Perinatal Project found
 non increased congenital malformations in over [id number] women
 exposed to this drug. I reassured her that she may resume
 the medication after discussing with her
 neuroophthalmologist if she has symptoms.
 Common side-effects include tingling in the hands and toes,
 metallic taste when carbonated drinks are consumed.
 Breastfeeding: According to the American Academy of
 Pediatrics, acetazolamide is compatible with breastfeeding.
 Regional anesthesia is safe and vaginal delivery is safe.
 Labor increases cardiac output and, consequently, increase
 CSF pressure. But this does not increase the risk for visual
 loss. Epidural analgesia with reasonable maternal effort in the
 second stage is safe. Outlet forceps or vacuum-assisted
 delivery may be required.
 Absent nasal bone
 I explained that absent nasal bone is a marker for Down
 syndrome. I discussed the significance and limitations of cell-
 free fetal DNA screening. Given that the risk for Down
 syndrome is not increased on cell-free fetal DNA screening,
 hypoplastic nasal bone should be considered a normal
 variant. I informed her that this finding is more common in
 African American population.
 Sickle-cell trait
 I discussed partner screening.  Patient reports her partner
 does not have sickle cell trait.  She is unlikely to carry a fetus
 with sickle cell disease.
Recommendations

 -An appointment was made for her to return in 4 weeks for
 completion of fetal anatomy.
 -Fetal growth assessment every 4 weeks till delivery.
                 [place], [place]

## 2023-07-31 IMAGING — US US MFM OB FOLLOW-UP
1 series · 13 of 28 positions shown · non-contrast
Comparison: none

[Series 1: us mfm ob follow-up · 52 acquisitions, 13 frames shown]
[im 2/52]
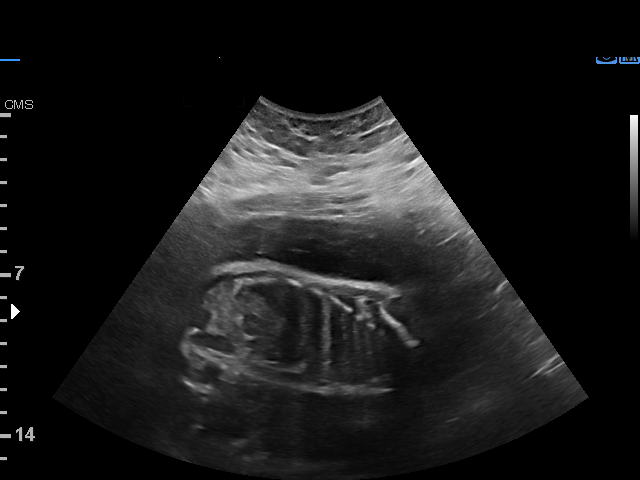
[im 6/52]
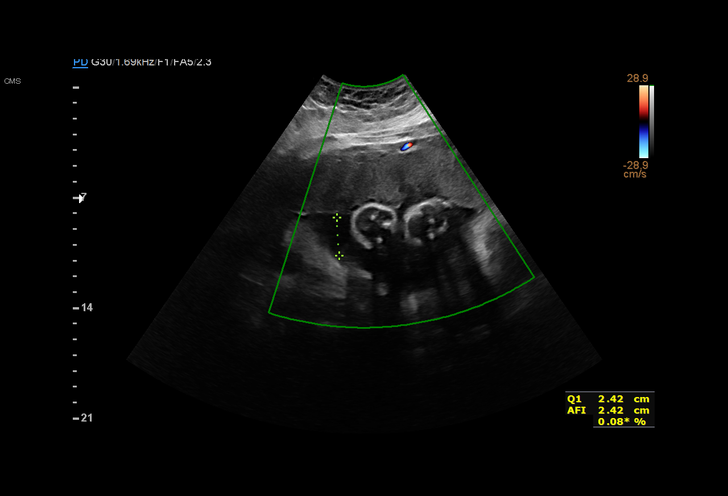
[im 10/52]
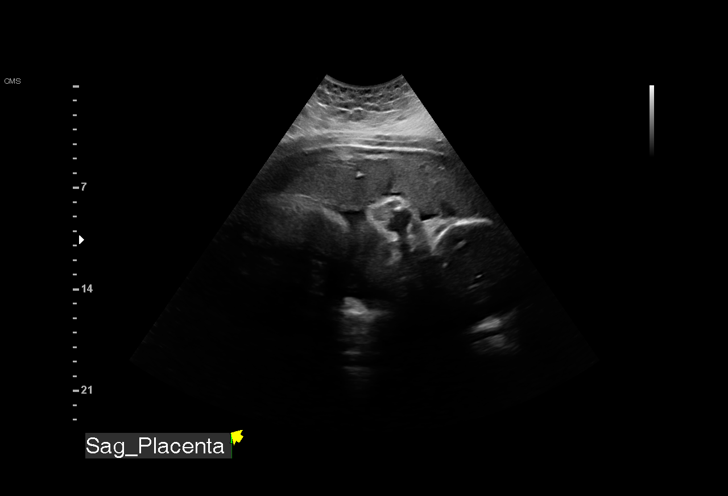
[im 14/52]
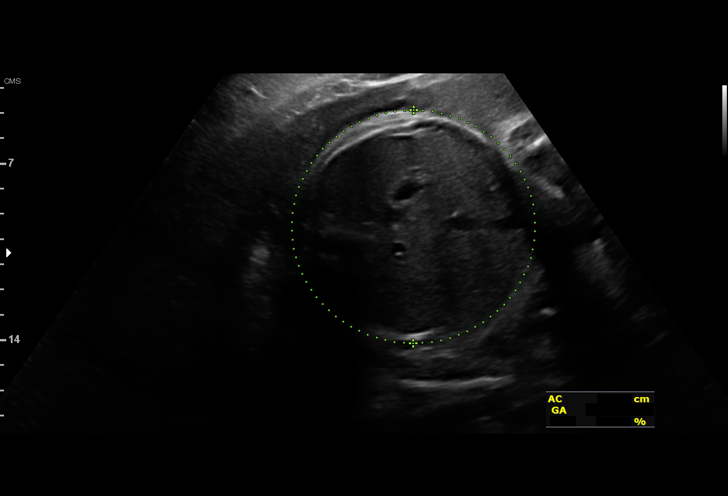
[im 18/52]
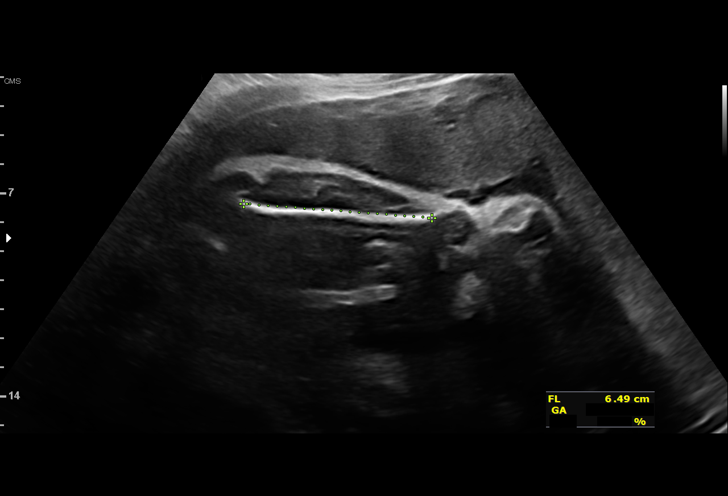
[im 21/52]
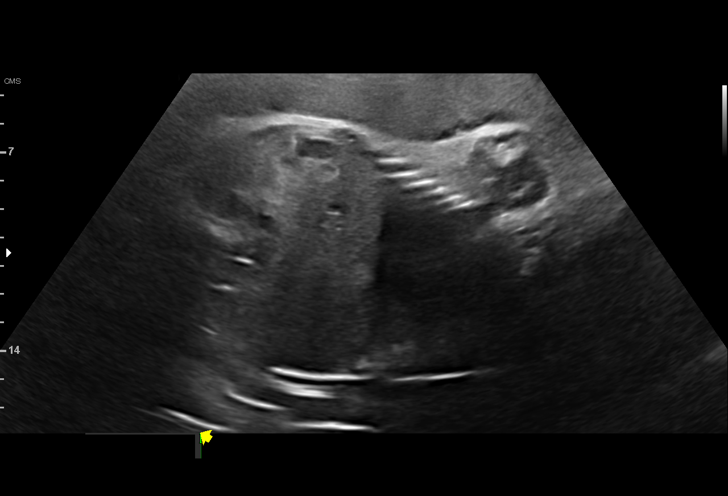
[im 27/52]
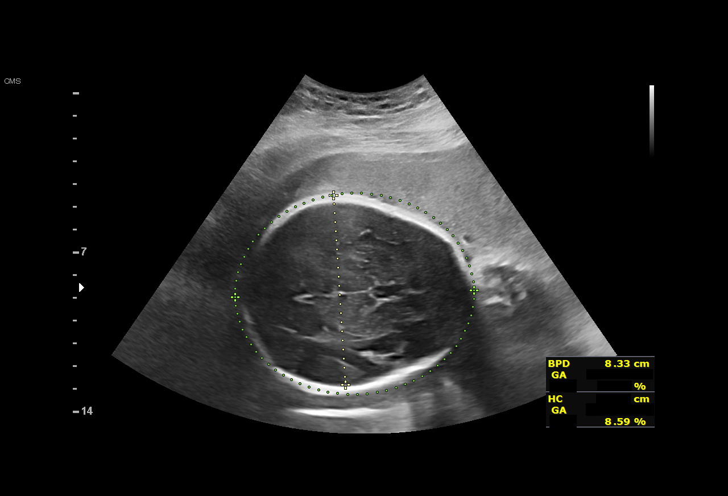
[im 31/52]
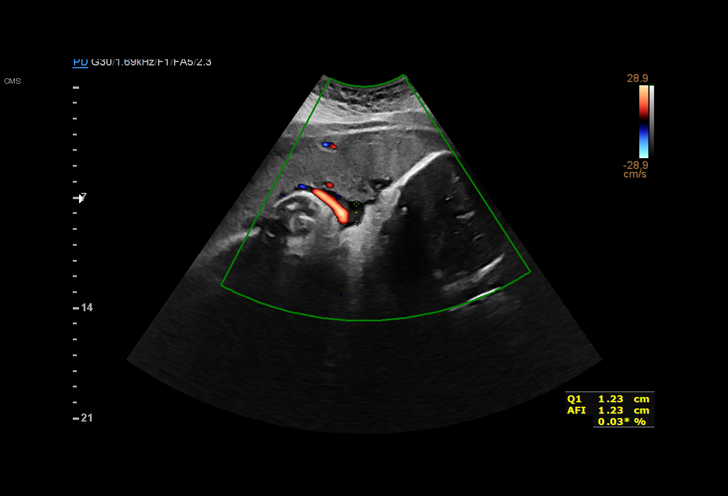
[im 35/52]
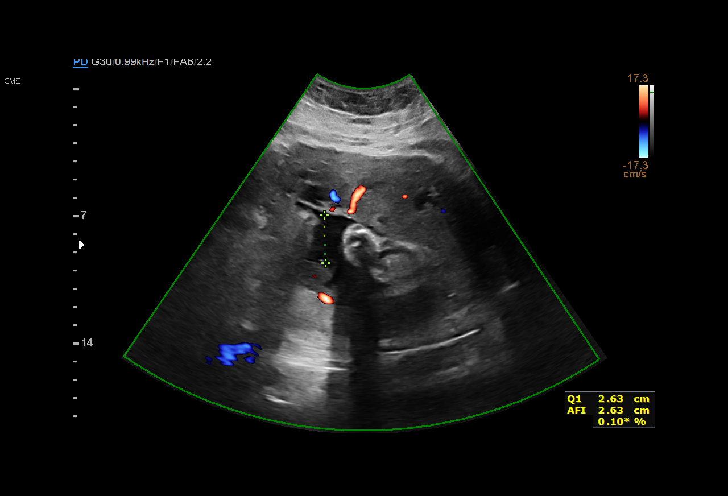
[im 38/52]
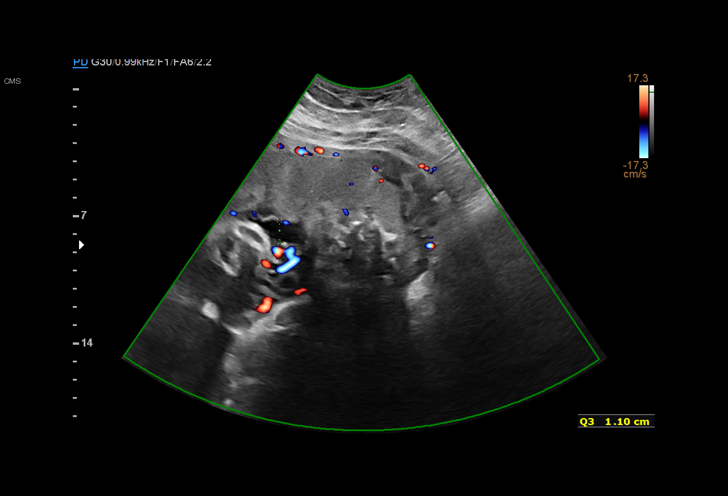
[im 42/52]
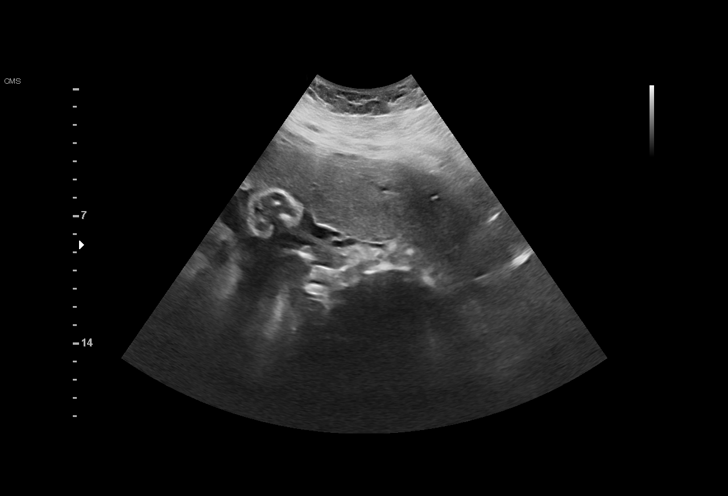
[im 46/52]
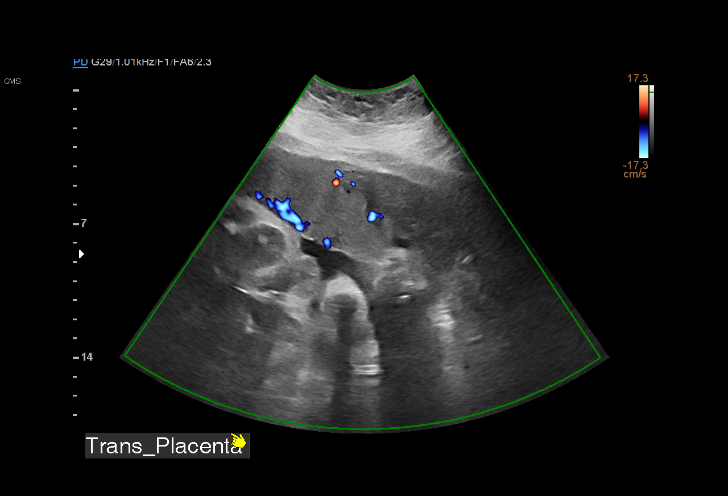
[im 50/52]
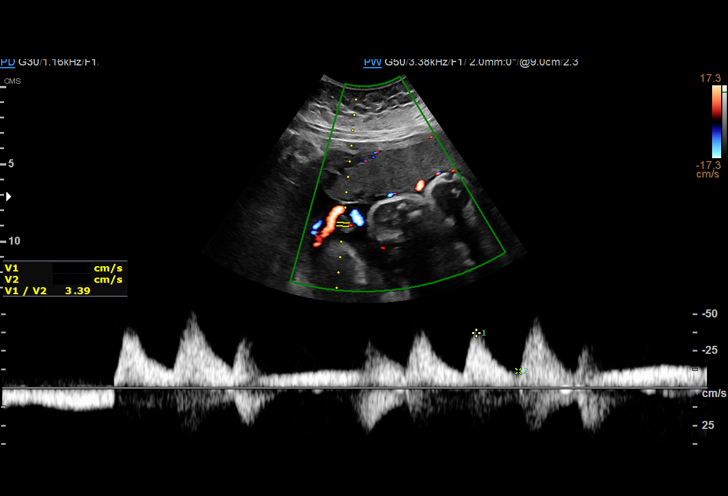

[13 of 28 positions shown; findings below may reference images not displayed]

Ref. Address:     Faculty

Indications

 34 weeks gestation of pregnancy
 Obesity complicating pregnancy, third
 trimester (pregravid BMI 42)
 Abnormal fetal ultrasound (absent nasal
 bone)
 Medical complication of pregnancy (History
 of idiopathic intracranial hypertension)
 History of sickle cell trait
 Oligohydraminios, third trimester, unspecified
Fetal Evaluation

 Num Of Fetuses:         1
 Fetal Heart Rate(bpm):  148
 Cardiac Activity:       Observed
 Presentation:           Cephalic
 Placenta:               Anterior
 P. Cord Insertion:      Previously Visualized

 Amniotic Fluid
 AFI FV:      Subjectively low-normal

 AFI Sum(cm)     %Tile       Largest Pocket(cm)
 6.2             < 3
 RUQ(cm)       RLQ(cm)       LUQ(cm)        LLQ(cm)
 2.6           0
Biophysical Evaluation

 Amniotic F.V:   Pocket => 2 cm             F. Tone:        Observed
 F. Movement:    Observed                   Score:          [DATE]
 F. Breathing:   Observed
Biometry

 BPD:      83.4  mm     G. Age:  33w 4d         25  %    CI:        73.95   %    70 - 86
                                                         FL/HC:      21.3   %    19.4 -
 HC:       308   mm     G. Age:  34w 3d         15  %    HC/AC:      1.03        0.96 -
 AC:      299.3  mm     G. Age:  33w 6d         39  %    FL/BPD:     78.8   %    71 - 87
 FL:       65.7  mm     G. Age:  33w 6d         26  %    FL/AC:      22.0   %    20 - 24
 LV:        1.6  mm

 Est. FW:    5065  gm      5 lb 1 oz     30  %
OB History

 Gravidity:    2         Term:   1        Prem:   0        SAB:   0
 TOP:          0       Ectopic:  0        Living: 1
Gestational Age

 LMP:           35w 2d        Date:  09/27/20                 EDD:   07/04/21
 U/S Today:     34w 0d                                        EDD:   07/13/21
 Best:          34w 3d     Det. By:  U/S C R L  (11/30/20)    EDD:   07/10/21
Anatomy

 Cranium:               Appears normal         Aortic Arch:            Previously seen
 Cavum:                 Previously seen        Ductal Arch:            Previously seen
 Ventricles:            Appears normal         Diaphragm:              Previously seen
 Choroid Plexus:        Previously seen        Stomach:                Appears normal, left
                                                                       sided
 Cerebellum:            Previously seen        Abdomen:                Appears normal
 Posterior Fossa:       Previously seen        Abdominal Wall:         Previously seen
 Nuchal Fold:           Not applicable (>20    Cord Vessels:           Previously seen
                        wks GA)
 Face:                  Absent nasal bone      Kidneys:                Appear normal
 Lips:                  Previously seen        Bladder:                Appears normal
 Thoracic:              Appears normal         Spine:                  Not well visualized
 Heart:                 Previously seen        Upper Extremities:      Previously seen
 RVOT:                  Previously seen        Lower Extremities:      Previously seen
 LVOT:                  Previously seen

 Other:  Hands previously visualized. Technically difficult due to maternal
         habitus and fetal position. Male gender previously seen.
Doppler - Fetal Vessels

 Umbilical Artery
  S/D     %tile                                              ADFV    RDFV
     3       78                                                 No      No
Cervix Uterus Adnexa

 Cervix
 Not visualized (advanced GA >22wks)

 Uterus
 No abnormality visualized.

 Right Ovary
 Not visualized.

 Left Ovary
 Not visualized.

 Cul De Sac
 No free fluid seen.

 Adnexa
 No adnexal mass visualized.
Comments

 MFM Brief Note

 Ms. Iraheta is a 31 yo G2P1 who is here at 34 w 3 d for a
 follow up growth exam at the request of Dr. Shubhm Ozukum.

 Ms Octavian pregnancy is complicated by a BMI of 40.

 A single intrauterine pregnancy was observed with
 measurements consistent with dates. There is good fetal
 movement. However today the amniotic fluid appears
 subjectively low.

 I discussed today's findings with Ms. Iraheta and she
 expressed that she felt that she has been leaking fluid and
 has had to wear a pad.

 She denies s/sx of infection or labor.

 I recommended that she go to the [facility] for further evaluation.

 I spoke with Fiofor Babcinschi who is aware of her coming.

 If she is deemed that her membranes are intact she will
 continue weekly testing. She will also was instructed for
 hydration.

 I spent 20 minutes with > 50% in face to face consultation.
